# Patient Record
Sex: Female | Born: 1940
Health system: Southern US, Community
[De-identification: ages and names within clinical notes are randomized; demographics above are authoritative.]

## PROBLEM LIST (undated history)

## (undated) DIAGNOSIS — M810 Age-related osteoporosis without current pathological fracture: Secondary | ICD-10-CM

---

## 2000-04-28 ENCOUNTER — Other Ambulatory Visit: Admission: RE | Admit: 2000-04-28 | Discharge: 2000-04-28 | Payer: Self-pay | Admitting: *Deleted

## 2000-08-31 ENCOUNTER — Encounter: Payer: Self-pay | Admitting: Family Medicine

## 2000-08-31 ENCOUNTER — Encounter: Admission: RE | Admit: 2000-08-31 | Discharge: 2000-08-31 | Payer: Self-pay | Admitting: Family Medicine

## 2002-03-17 ENCOUNTER — Ambulatory Visit (HOSPITAL_BASED_OUTPATIENT_CLINIC_OR_DEPARTMENT_OTHER): Admission: RE | Admit: 2002-03-17 | Discharge: 2002-03-17 | Payer: Self-pay | Admitting: Orthopedic Surgery

## 2002-05-01 ENCOUNTER — Other Ambulatory Visit: Admission: RE | Admit: 2002-05-01 | Discharge: 2002-05-01 | Payer: Self-pay | Admitting: Obstetrics & Gynecology

## 2008-12-13 ENCOUNTER — Encounter: Admission: RE | Admit: 2008-12-13 | Discharge: 2008-12-13 | Payer: Self-pay | Admitting: Family Medicine

## 2010-07-04 NOTE — Op Note (Signed)
NAME:  Dana Watson, Dana Watson                           ACCOUNT NO.:  [id number]   MEDICAL RECORD NO.:  [id number]                   PATIENT TYPE:  AMB   LOCATION:  DSC                                  FACILITY:  [facility]   PHYSICIAN:  Katy Fitch. Naaman Plummer., M.D.          DATE OF BIRTH:  September 13, 1940   DATE OF PROCEDURE:  03/17/2002  DATE OF DISCHARGE:                                 OPERATIVE REPORT   PREOPERATIVE DIAGNOSIS:  Enlarging mucous cyst, left index finger distal  interphalangeal joint, with degenerative arthritis of distal interphalangeal  joint.   POSTOPERATIVE DIAGNOSIS:  Enlarging mucous cyst, left index finger distal  interphalangeal joint, with degenerative arthritis of distal interphalangeal  joint.   PROCEDURES:  1. Arthrotomy of left index finger distal interphalangeal joint with     debridement of osteophytes from adjacent surfaces of distal and middle     phalanges.  2. Excision of mucous cyst from periungual region, left index finger.   SURGEON:  Katy Fitch. Sypher, M.D.   ASSISTANT:  Jonni Sanger, P.A.   ANESTHESIA:  0.25% Marcaine and 2% lidocaine metacarpal-head level block,  left index finger, supplemented by IV sedation supervised by the  anesthesiologist, Janetta Hora. Gelene Mink, M.D.   INDICATIONS:  The patient is a 70 year old woman referred for evaluation and  management of a large mucous cyst of the left index finger distal  interphalangeal joint.   After informed consent, she is brought to the operating room at this time  for excision and joint debridement.  Preoperative x-rays demonstrated  significant degenerative arthritis at her DIP joint.   DESCRIPTION OF PROCEDURE:  The patient is brought to the operating room and  placed in the supine position on the operating table.  Following light  sedation, the left arm was prepped with Betadine soap and solution and  sterilely draped.  Marcaine 0.25% and 2% lidocaine were infiltrated at the  metacarpal  head level to obtain a digital block.   When anesthesia was satisfactory, the procedure commenced with a curvilinear  incision directly over the cyst.  The subcutaneous tissues were carefully  divided with the scissors, revealing a well-circumscribed mucous cyst.  The  cyst was circumferentially dissected and excised from the joint capsule with  a small portion of normal capsule between the terminal extensor tendon slip  and the ulnar collateral ligament of the DIP joint.   The joint was then entered and a microcurette was used to remove osteophytes  from the adjacent surfaces of the distal and middle phalanges.   The joint was thoroughly irrigated with sterile saline, followed by thorough  debridement of the periungual region to remove all remnants of the cyst  membrane.   The wound was then repaired with intradermal 3-0 Prolene.  There were no  apparent complications.   For aftercare the patient was given a prescription for Keflex 500 mg one  p.o. q.8h. x4 days  as a prophylactic antibiotic, also Darvocet-N 100 one  p.o. q.4h. p.r.n. pan, 24 tablets without refill.   The patient will return for follow-up in seven to 10 days for dressing  change and suture removal.                                               Katy Fitch. Naaman Plummer., M.D.    RVS/MEDQ  D:  03/17/2002  T:  03/17/2002  Job:  [id number]

## 2013-12-20 ENCOUNTER — Encounter: Payer: Self-pay | Admitting: Internal Medicine

## 2014-05-14 ENCOUNTER — Encounter: Payer: Self-pay | Admitting: Internal Medicine

## 2014-12-24 ENCOUNTER — Other Ambulatory Visit: Payer: Self-pay | Admitting: Physician Assistant

## 2014-12-24 DIAGNOSIS — M25562 Pain in left knee: Secondary | ICD-10-CM

## 2015-01-07 ENCOUNTER — Ambulatory Visit
Admission: RE | Admit: 2015-01-07 | Discharge: 2015-01-07 | Disposition: A | Discharge status: Home / Self Care | Payer: Self-pay | Source: Ambulatory Visit | Attending: Physician Assistant | Admitting: Physician Assistant

## 2015-01-07 DIAGNOSIS — M25562 Pain in left knee: Secondary | ICD-10-CM

## 2015-02-19 DIAGNOSIS — J019 Acute sinusitis, unspecified: Secondary | ICD-10-CM | POA: Diagnosis not present

## 2015-02-27 DIAGNOSIS — M25462 Effusion, left knee: Secondary | ICD-10-CM | POA: Diagnosis not present

## 2015-02-27 DIAGNOSIS — M25562 Pain in left knee: Secondary | ICD-10-CM | POA: Diagnosis not present

## 2015-05-22 DIAGNOSIS — M25562 Pain in left knee: Secondary | ICD-10-CM | POA: Diagnosis not present

## 2015-05-22 DIAGNOSIS — M25462 Effusion, left knee: Secondary | ICD-10-CM | POA: Diagnosis not present

## 2015-06-19 DIAGNOSIS — M1712 Unilateral primary osteoarthritis, left knee: Secondary | ICD-10-CM | POA: Diagnosis not present

## 2015-08-19 DIAGNOSIS — M25562 Pain in left knee: Secondary | ICD-10-CM | POA: Diagnosis not present

## 2015-09-12 DIAGNOSIS — Y999 Unspecified external cause status: Secondary | ICD-10-CM | POA: Diagnosis not present

## 2015-09-12 DIAGNOSIS — M23332 Other meniscus derangements, other medial meniscus, left knee: Secondary | ICD-10-CM | POA: Diagnosis not present

## 2015-09-12 DIAGNOSIS — S83242A Other tear of medial meniscus, current injury, left knee, initial encounter: Secondary | ICD-10-CM | POA: Diagnosis not present

## 2015-09-12 DIAGNOSIS — M659 Synovitis and tenosynovitis, unspecified: Secondary | ICD-10-CM | POA: Diagnosis not present

## 2015-09-12 DIAGNOSIS — M794 Hypertrophy of (infrapatellar) fat pad: Secondary | ICD-10-CM | POA: Diagnosis not present

## 2015-09-30 DIAGNOSIS — Z803 Family history of malignant neoplasm of breast: Secondary | ICD-10-CM | POA: Diagnosis not present

## 2015-09-30 DIAGNOSIS — Z1231 Encounter for screening mammogram for malignant neoplasm of breast: Secondary | ICD-10-CM | POA: Diagnosis not present

## 2015-10-17 DIAGNOSIS — M25562 Pain in left knee: Secondary | ICD-10-CM | POA: Diagnosis not present

## 2015-12-09 DIAGNOSIS — Z23 Encounter for immunization: Secondary | ICD-10-CM | POA: Diagnosis not present

## 2016-01-23 DIAGNOSIS — M810 Age-related osteoporosis without current pathological fracture: Secondary | ICD-10-CM | POA: Diagnosis not present

## 2016-01-29 DIAGNOSIS — Z0000 Encounter for general adult medical examination without abnormal findings: Secondary | ICD-10-CM | POA: Diagnosis not present

## 2016-01-29 DIAGNOSIS — M8580 Other specified disorders of bone density and structure, unspecified site: Secondary | ICD-10-CM | POA: Diagnosis not present

## 2016-02-06 DIAGNOSIS — H5213 Myopia, bilateral: Secondary | ICD-10-CM | POA: Diagnosis not present

## 2016-02-06 DIAGNOSIS — H52222 Regular astigmatism, left eye: Secondary | ICD-10-CM | POA: Diagnosis not present

## 2016-02-06 DIAGNOSIS — H524 Presbyopia: Secondary | ICD-10-CM | POA: Diagnosis not present

## 2016-06-15 ENCOUNTER — Other Ambulatory Visit: Payer: Self-pay | Admitting: Physician Assistant

## 2016-06-15 DIAGNOSIS — M5416 Radiculopathy, lumbar region: Secondary | ICD-10-CM

## 2016-10-06 DIAGNOSIS — Z1231 Encounter for screening mammogram for malignant neoplasm of breast: Secondary | ICD-10-CM | POA: Diagnosis not present

## 2016-11-19 DIAGNOSIS — Z23 Encounter for immunization: Secondary | ICD-10-CM | POA: Diagnosis not present

## 2017-01-29 DIAGNOSIS — M1990 Unspecified osteoarthritis, unspecified site: Secondary | ICD-10-CM | POA: Diagnosis not present

## 2017-01-29 DIAGNOSIS — M810 Age-related osteoporosis without current pathological fracture: Secondary | ICD-10-CM | POA: Diagnosis not present

## 2017-01-29 DIAGNOSIS — Z0000 Encounter for general adult medical examination without abnormal findings: Secondary | ICD-10-CM | POA: Diagnosis not present

## 2017-01-29 DIAGNOSIS — G2581 Restless legs syndrome: Secondary | ICD-10-CM | POA: Diagnosis not present

## 2017-06-28 DIAGNOSIS — J069 Acute upper respiratory infection, unspecified: Secondary | ICD-10-CM | POA: Diagnosis not present

## 2017-10-12 DIAGNOSIS — Z803 Family history of malignant neoplasm of breast: Secondary | ICD-10-CM | POA: Diagnosis not present

## 2017-10-12 DIAGNOSIS — Z1231 Encounter for screening mammogram for malignant neoplasm of breast: Secondary | ICD-10-CM | POA: Diagnosis not present

## 2017-12-03 DIAGNOSIS — Z23 Encounter for immunization: Secondary | ICD-10-CM | POA: Diagnosis not present

## 2018-01-25 DIAGNOSIS — Z8262 Family history of osteoporosis: Secondary | ICD-10-CM | POA: Diagnosis not present

## 2018-01-25 DIAGNOSIS — M8589 Other specified disorders of bone density and structure, multiple sites: Secondary | ICD-10-CM | POA: Diagnosis not present

## 2018-01-25 DIAGNOSIS — Z90710 Acquired absence of both cervix and uterus: Secondary | ICD-10-CM | POA: Diagnosis not present

## 2018-02-01 DIAGNOSIS — Z0000 Encounter for general adult medical examination without abnormal findings: Secondary | ICD-10-CM | POA: Diagnosis not present

## 2018-02-01 DIAGNOSIS — I493 Ventricular premature depolarization: Secondary | ICD-10-CM | POA: Diagnosis not present

## 2018-02-01 DIAGNOSIS — Z13220 Encounter for screening for lipoid disorders: Secondary | ICD-10-CM | POA: Diagnosis not present

## 2018-02-01 DIAGNOSIS — M1990 Unspecified osteoarthritis, unspecified site: Secondary | ICD-10-CM | POA: Diagnosis not present

## 2018-02-01 DIAGNOSIS — M810 Age-related osteoporosis without current pathological fracture: Secondary | ICD-10-CM | POA: Diagnosis not present

## 2018-02-01 DIAGNOSIS — G2581 Restless legs syndrome: Secondary | ICD-10-CM | POA: Diagnosis not present

## 2018-03-21 DIAGNOSIS — J069 Acute upper respiratory infection, unspecified: Secondary | ICD-10-CM | POA: Diagnosis not present

## 2018-10-21 DIAGNOSIS — Z803 Family history of malignant neoplasm of breast: Secondary | ICD-10-CM | POA: Diagnosis not present

## 2018-10-21 DIAGNOSIS — Z1231 Encounter for screening mammogram for malignant neoplasm of breast: Secondary | ICD-10-CM | POA: Diagnosis not present

## 2018-11-19 DIAGNOSIS — Z23 Encounter for immunization: Secondary | ICD-10-CM | POA: Diagnosis not present

## 2019-02-07 ENCOUNTER — Other Ambulatory Visit: Payer: Self-pay | Admitting: Family Medicine

## 2019-02-07 ENCOUNTER — Ambulatory Visit
Admission: RE | Admit: 2019-02-07 | Discharge: 2019-02-07 | Disposition: A | Discharge status: Home / Self Care | Payer: PPO | Source: Ambulatory Visit | Attending: Family Medicine | Admitting: Family Medicine

## 2019-02-07 DIAGNOSIS — S99922A Unspecified injury of left foot, initial encounter: Secondary | ICD-10-CM | POA: Diagnosis not present

## 2019-02-07 DIAGNOSIS — M25562 Pain in left knee: Secondary | ICD-10-CM | POA: Diagnosis not present

## 2019-02-07 DIAGNOSIS — R52 Pain, unspecified: Secondary | ICD-10-CM

## 2019-02-07 DIAGNOSIS — W19XXXA Unspecified fall, initial encounter: Secondary | ICD-10-CM | POA: Diagnosis not present

## 2019-02-07 DIAGNOSIS — M25572 Pain in left ankle and joints of left foot: Secondary | ICD-10-CM | POA: Diagnosis not present

## 2019-02-07 DIAGNOSIS — M79672 Pain in left foot: Secondary | ICD-10-CM | POA: Diagnosis not present

## 2019-02-07 DIAGNOSIS — M25462 Effusion, left knee: Secondary | ICD-10-CM | POA: Diagnosis not present

## 2019-02-08 ENCOUNTER — Other Ambulatory Visit: Payer: Self-pay

## 2019-02-08 ENCOUNTER — Telehealth: Payer: Self-pay

## 2019-02-08 ENCOUNTER — Ambulatory Visit (INDEPENDENT_AMBULATORY_CARE_PROVIDER_SITE_OTHER): Payer: PPO | Admitting: Orthopaedic Surgery

## 2019-02-08 DIAGNOSIS — M25562 Pain in left knee: Secondary | ICD-10-CM | POA: Diagnosis not present

## 2019-02-08 DIAGNOSIS — S93602A Unspecified sprain of left foot, initial encounter: Secondary | ICD-10-CM

## 2019-02-08 DIAGNOSIS — G8929 Other chronic pain: Secondary | ICD-10-CM | POA: Diagnosis not present

## 2019-02-08 DIAGNOSIS — M1712 Unilateral primary osteoarthritis, left knee: Secondary | ICD-10-CM | POA: Diagnosis not present

## 2019-02-08 MED ORDER — LIDOCAINE HCL 1 % IJ SOLN
3.0000 mL | INTRAMUSCULAR | Status: AC | PRN
  Administered 2019-02-08: 3 mL

## 2019-02-08 MED ORDER — METHYLPREDNISOLONE ACETATE 40 MG/ML IJ SUSP
40.0000 mg | INTRAMUSCULAR | Status: AC | PRN
  Administered 2019-02-08: 40 mg via INTRA_ARTICULAR

## 2019-02-08 NOTE — Telephone Encounter (Signed)
Left knee gel injection ?

## 2019-02-08 NOTE — Telephone Encounter (Signed)
Noted. Will submit after 02/20/2019. 

## 2019-02-08 NOTE — Progress Notes (Signed)
Office Visit Note   Patient: Dana Watson           Date of Birth: 1940-09-09           MRN: [id number] Visit Date: 02/08/2019              Requested by: Maurice Small, MD [address] [place],  [place] [address] PCP: Maurice Small, MD   Assessment & Plan: Visit Diagnoses:  1. Chronic pain of left knee   2. Unilateral primary osteoarthritis, left knee   3. Foot sprain, left, initial encounter     Plan: I was able to aspirate about 20 cc of bloody fluid from the knee.  I then placed a steroid in her left knee which she tolerated well.  Since she is already improving with her foot and ankle from Monday with the postop shoe we will just have her continue that with weightbearing as tolerated.  The x-rays of her left knee are quite concerning for significant worsening of arthritic changes in the knee.  I do feel a steroid injection will be helpful for her.  I also feel that she is a candidate for hyaluronic acid for the left knee given the arthritis in her age.  She is interested in this as well.  Hopefully we will see her back in 4 weeks we can place hyaluronic acid into the left knee.  All question concerns were answered and addressed.  Follow-Up Instructions: Return in about 4 weeks (around 03/08/2019).   Orders:  Orders Placed This Encounter  Procedures  . Large Joint Inj   No orders of the defined types were placed in this encounter.     Procedures: Large Joint Inj: L knee on 02/08/2019 11:01 AM Indications: diagnostic evaluation and pain Details: 22 G 1.5 in needle, superolateral approach  Arthrogram: No  Medications: 3 mL lidocaine 1 %; 40 mg methylPREDNISolone acetate 40 MG/ML Outcome: tolerated well, no immediate complications Procedure, treatment alternatives, risks and benefits explained, specific risks discussed. Consent was given by the patient. Immediately prior to procedure a time out was called to verify the correct patient, procedure,  equipment, support staff and site/side marked as required. Patient was prepped and draped in the usual sterile fashion.       Clinical Data: No additional findings.   Subjective: Chief Complaint  Patient presents with  . Left Foot - Pain  The patient is a well-known patient of mine.  She is 78 years old and actually fell this past Monday down some steps injuring her left foot and actually her left knee.  Have seen her before for this left knee and actually performed an arthroscopic surgery on it in 2016 for PVNS.  She said the knee is already been bothering her for little bit but now the foot is hurting.  X-rays were obtained of the knee and the foot on Monday and I can review these on the canopy system.  Her primary care physician put her in a postop shoe and she says this is helped.  She denies any syncopal episode and says it was just an accidental tripping incident when she fell.  She does report swelling in that left leg from the knee down to the foot and ankle.  She was going to schedule pulmonary see [place] earlier anyway for her knee before she fell.  HPI  Review of Systems She currently denies any headache, chest pain, shortness of breath, fever, chills, nausea, vomiting  Objective: Vital  Signs: There were no vitals taken for this visit.  Physical Exam She is alert orient x3 and in no acute distress Ortho Exam Examination of her left knee does show swelling and there is pitting edema from the knee down to the foot and ankle.  There is swelling at the midfoot and pain to the midfoot on stress but there is no instability on exam of the foot or ankle.  She has good range of motion of her knee foot and ankle. Specialty Comments:  No specialty comments available.  Imaging: DG Knee Complete 4 Views Left  Result Date: 02/07/2019 CLINICAL DATA:  Trauma with pain EXAM: LEFT KNEE - COMPLETE 4+ VIEW COMPARISON:  None. FINDINGS: Joint effusion. Generalized osteopenia. No acute fracture.  Marked chondrocalcinosis. IMPRESSION: No acute fracture.  Joint effusion. Electronically Signed   By: Leticia Penna M.D.   On: 02/07/2019 11:14   DG Foot Complete Left  Result Date: 02/07/2019 CLINICAL DATA:  Trauma with pain EXAM: LEFT FOOT - COMPLETE 3+ VIEW COMPARISON:  None. FINDINGS: There is no evidence of fracture or dislocation. There is no evidence of arthropathy or other focal bone abnormality. Soft tissues are unremarkable. IMPRESSION: Negative. Electronically Signed   By: Leticia Penna M.D.   On: 02/07/2019 11:13     PMFS History: There are no problems to display for this patient.  No past medical history on file.  No family history on file.   Social History   Occupational History  . Not on file  Tobacco Use  . Smoking status: Not on file  Substance and Sexual Activity  . Alcohol use: Not on file  . Drug use: Not on file  . Sexual activity: Not on file

## 2019-02-16 DIAGNOSIS — M810 Age-related osteoporosis without current pathological fracture: Secondary | ICD-10-CM | POA: Diagnosis not present

## 2019-02-16 DIAGNOSIS — Z0000 Encounter for general adult medical examination without abnormal findings: Secondary | ICD-10-CM | POA: Diagnosis not present

## 2019-02-20 ENCOUNTER — Telehealth: Payer: Self-pay

## 2019-02-20 NOTE — Telephone Encounter (Signed)
Submitted VOB for Monovisc, left knee. 

## 2019-02-28 ENCOUNTER — Telehealth: Payer: Self-pay

## 2019-02-28 NOTE — Telephone Encounter (Signed)
Approved for Monovisc, left knee. [employer] Deductible does not apply Patient is covered at 100% through her secondary insurance No Co-pay No PA required  Appt. 03/08/2019 with Dr. Magnus Ivan

## 2019-03-08 ENCOUNTER — Ambulatory Visit (INDEPENDENT_AMBULATORY_CARE_PROVIDER_SITE_OTHER): Payer: PPO | Admitting: Orthopaedic Surgery

## 2019-03-08 ENCOUNTER — Encounter: Payer: Self-pay | Admitting: Orthopaedic Surgery

## 2019-03-08 ENCOUNTER — Other Ambulatory Visit: Payer: Self-pay

## 2019-03-08 DIAGNOSIS — G8929 Other chronic pain: Secondary | ICD-10-CM

## 2019-03-08 DIAGNOSIS — M1712 Unilateral primary osteoarthritis, left knee: Secondary | ICD-10-CM

## 2019-03-08 DIAGNOSIS — M25562 Pain in left knee: Secondary | ICD-10-CM

## 2019-03-08 MED ORDER — HYALURONAN 88 MG/4ML IX SOSY
88.0000 mg | PREFILLED_SYRINGE | INTRA_ARTICULAR | Status: AC | PRN
  Administered 2019-03-08: 88 mg via INTRA_ARTICULAR

## 2019-03-08 NOTE — Progress Notes (Signed)
   Procedure Note  Patient: Dana Watson             Date of Birth: 07/20/1940           MRN: [id number]             Visit Date: 03/08/2019  Procedures: Visit Diagnoses:  1. Unilateral primary osteoarthritis, left knee   2. Chronic pain of left knee     Large Joint Inj: L knee on 03/08/2019 1:39 PM Indications: diagnostic evaluation and pain Details: 22 G 1.5 in needle, superolateral approach  Arthrogram: No  Medications: 88 mg Hyaluronan 88 MG/4ML Outcome: tolerated well, no immediate complications Procedure, treatment alternatives, risks and benefits explained, specific risks discussed. Consent was given by the patient. Immediately prior to procedure a time out was called to verify the correct patient, procedure, equipment, support staff and site/side marked as required. Patient was prepped and draped in the usual sterile fashion.    Patient comes in today for a scheduled Monovisc injection in her left knee to treat the pain from osteoarthritis.  She is tried and failed other forms of conservative treatment.  She does have osteoarthritis in that knee as well as chronic PVNS.  She is even tried steroid injections.  She is a very active 79 year old female.  She also has chronic left ankle swelling.  It does subside when she has been off of her ankle and she wears compressive garments.  She has had no other acute change in her medical status.  I did have a long thorough discussion with her about hyaluronic acid and the risk and benefits of this and why we are trying it.  I did place hyaluronic acid in the left knee without difficulty today.  All question concerns were answered and addressed.  At this point follow-up will be as needed.

## 2019-10-24 DIAGNOSIS — Z1231 Encounter for screening mammogram for malignant neoplasm of breast: Secondary | ICD-10-CM | POA: Diagnosis not present

## 2019-11-11 DIAGNOSIS — Z23 Encounter for immunization: Secondary | ICD-10-CM | POA: Diagnosis not present

## 2019-11-30 ENCOUNTER — Telehealth: Payer: Self-pay | Admitting: Orthopaedic Surgery

## 2019-11-30 NOTE — Telephone Encounter (Signed)
Patient called needing to get the gel injection again for her left knee.  The number to contact patient is [phone]

## 2019-11-30 NOTE — Telephone Encounter (Signed)
As long as it is been 6 months, it is fine to order that for her.

## 2019-11-30 NOTE — Telephone Encounter (Signed)
Can you advise please  

## 2019-12-01 NOTE — Telephone Encounter (Signed)
Noted  

## 2019-12-07 ENCOUNTER — Ambulatory Visit (INDEPENDENT_AMBULATORY_CARE_PROVIDER_SITE_OTHER): Payer: PPO | Admitting: Orthopaedic Surgery

## 2019-12-07 DIAGNOSIS — G8929 Other chronic pain: Secondary | ICD-10-CM | POA: Diagnosis not present

## 2019-12-07 DIAGNOSIS — M1712 Unilateral primary osteoarthritis, left knee: Secondary | ICD-10-CM | POA: Diagnosis not present

## 2019-12-07 DIAGNOSIS — M25562 Pain in left knee: Secondary | ICD-10-CM | POA: Diagnosis not present

## 2019-12-07 MED ORDER — LIDOCAINE HCL 1 % IJ SOLN
3.0000 mL | INTRAMUSCULAR | Status: AC | PRN
  Administered 2019-12-07: 3 mL

## 2019-12-07 MED ORDER — METHYLPREDNISOLONE ACETATE 40 MG/ML IJ SUSP
40.0000 mg | INTRAMUSCULAR | Status: AC | PRN
  Administered 2019-12-07: 40 mg via INTRA_ARTICULAR

## 2019-12-07 NOTE — Progress Notes (Signed)
Office Visit Note   Patient: Dana Watson           Date of Birth: Sep 12, 1940           MRN: [id number] Visit Date: 12/07/2019              Requested by: Maurice Small, MD [address] [place],  [place] [address] PCP: Maurice Small, MD   Assessment & Plan: Visit Diagnoses:  1. Unilateral primary osteoarthritis, left knee   2. Chronic pain of left knee     Plan: I did place a steroid injection easily into the left knee today.  We will work on ordering the hyaluronic acid injection with Monovisc for her left knee and see her back once that is approved.  I have recommended that she start wearing her ASO again and see what I does for her ankle and try to wear it for the next 4 to 6 weeks during activities.  Follow-Up Instructions: No follow-ups on file.   Orders:  No orders of the defined types were placed in this encounter.  No orders of the defined types were placed in this encounter.     Procedures: Large Joint Inj: L knee on 12/07/2019 3:42 PM Indications: diagnostic evaluation and pain Details: 22 G 1.5 in needle, superolateral approach  Arthrogram: No  Medications: 3 mL lidocaine 1 %; 40 mg methylPREDNISolone acetate 40 MG/ML Outcome: tolerated well, no immediate complications Procedure, treatment alternatives, risks and benefits explained, specific risks discussed. Consent was given by the patient. Immediately prior to procedure a time out was called to verify the correct patient, procedure, equipment, support staff and site/side marked as required. Patient was prepped and draped in the usual sterile fashion.       Clinical Data: No additional findings.   Subjective: Chief Complaint  Patient presents with  . Left Knee - Pain  The patient is well-known to me.  She has osteoarthritis of her left knee and chronic pain.  She has done very well with the regimen of a steroid injection and then her pain is improved significantly with hyaluronic  acid and Monovisc.  She last had a Monovisc injection in her left knee in January of this year.  She is in the process of having this ordered again for her knee.  In order to temporize her acute pain, she is requesting steroid injection for her knee.  She is also been dealing with left ankle pain from where she sprained her ankle earlier this year.  She does have a lace up ankle brace at home but has not been wearing it.  She has had no other acute change in medical status.  HPI  Review of Systems She currently denies any headache, chest pain, shortness of breath, fever, chills, nausea, vomiting  Objective: Vital Signs: There were no vitals taken for this visit.  Physical Exam She is alert and oriented x3 and in no acute distress Ortho Exam Examination of her left knee does show some mild swelling and global tenderness but good range of motion.  There is a little bit of patellofemoral crepitation.  Her left ankle is ligamentously stable with pain just over the anterior talofibular ligament and some over the peroneal tendons but there is no subluxation of the tendons. Specialty Comments:  No specialty comments available.  Imaging: No results found.   PMFS History: There are no problems to display for this patient.  No past medical history on file.  No  family history on file.  No past surgical history on file. Social History   Occupational History  . Not on file  Tobacco Use  . Smoking status: Not on file  Substance and Sexual Activity  . Alcohol use: Not on file  . Drug use: Not on file  . Sexual activity: Not on file

## 2019-12-18 ENCOUNTER — Telehealth: Payer: Self-pay

## 2019-12-18 NOTE — Telephone Encounter (Signed)
Submitted VOB, Monovisc, left knee. 

## 2020-01-03 DIAGNOSIS — Z20822 Contact with and (suspected) exposure to covid-19: Secondary | ICD-10-CM | POA: Diagnosis not present

## 2020-01-03 DIAGNOSIS — Z1152 Encounter for screening for COVID-19: Secondary | ICD-10-CM | POA: Diagnosis not present

## 2020-01-03 DIAGNOSIS — Z6821 Body mass index (BMI) 21.0-21.9, adult: Secondary | ICD-10-CM | POA: Diagnosis not present

## 2020-01-19 ENCOUNTER — Telehealth: Payer: Self-pay

## 2020-01-19 NOTE — Telephone Encounter (Signed)
Approved for Monovisc-Left knee Buy and Bill Dr. Magnus Ivan Covered @ 100%-[facility] to pick up cost after [employer]  No prior auth required    Ok to schedule @ next available

## 2020-01-22 NOTE — Telephone Encounter (Signed)
Lvm for pt to call back to schedule °

## 2020-03-18 ENCOUNTER — Ambulatory Visit: Payer: Self-pay

## 2020-03-18 ENCOUNTER — Ambulatory Visit (INDEPENDENT_AMBULATORY_CARE_PROVIDER_SITE_OTHER): Payer: PPO | Admitting: Orthopaedic Surgery

## 2020-03-18 DIAGNOSIS — M25562 Pain in left knee: Secondary | ICD-10-CM | POA: Diagnosis not present

## 2020-03-18 DIAGNOSIS — G8929 Other chronic pain: Secondary | ICD-10-CM

## 2020-03-18 DIAGNOSIS — M1712 Unilateral primary osteoarthritis, left knee: Secondary | ICD-10-CM | POA: Diagnosis not present

## 2020-03-18 MED ORDER — HYALURONAN 88 MG/4ML IX SOSY
88.0000 mg | PREFILLED_SYRINGE | INTRA_ARTICULAR | Status: AC | PRN
  Administered 2020-03-18: 88 mg via INTRA_ARTICULAR

## 2020-03-18 MED ORDER — LIDOCAINE HCL 1 % IJ SOLN
3.0000 mL | INTRAMUSCULAR | Status: AC | PRN
  Administered 2020-03-18: 3 mL

## 2020-03-18 NOTE — Progress Notes (Signed)
Office Visit Note   Patient: Dana Watson           Date of Birth: 05/16/1940           MRN: [id number] Visit Date: 03/18/2020              Requested by: Maurice Small, MD [address] [place],  [place] [address] PCP: Maurice Small, MD   Assessment & Plan: Visit Diagnoses:  1. Chronic pain of left knee   2. Unilateral primary osteoarthritis, left knee     Plan: I do feel it is worth trying a left knee aspiration today and place Monovisc in her left knee to see if this will help from a pain standpoint.  We can then reevaluate her in 6 weeks to see how she is done with that.  The only other option after that would be a synovectomy with knee replacement for the left knee.  She agrees with this treatment plan.  Follow-Up Instructions: Return in about 6 weeks (around 04/29/2020).   Orders:  Orders Placed This Encounter  Procedures  . Large Joint Inj  . XR Knee 1-2 Views Left   No orders of the defined types were placed in this encounter.     Procedures: Large Joint Inj: L knee on 03/18/2020 9:13 AM Indications: diagnostic evaluation and pain Details: 22 G 1.5 in needle, superolateral approach  Arthrogram: No  Medications: 3 mL lidocaine 1 %; 88 mg Hyaluronan 88 MG/4ML Outcome: tolerated well, no immediate complications Procedure, treatment alternatives, risks and benefits explained, specific risks discussed. Consent was given by the patient. Immediately prior to procedure a time out was called to verify the correct patient, procedure, equipment, support staff and site/side marked as required. Patient was prepped and draped in the usual sterile fashion.       Clinical Data: No additional findings.   Subjective: Chief Complaint  Patient presents with  . Left Knee - Pain  The patient is well-known to me.  She is a very active and young appearing 80 year old female with chronic pain involving her left knee.  She has had PVNS of her knee before.  We  have performed arthroscopic surgery on that knee and she has had multiple aspirations and steroid injections.  She is here today to have her knee examined and assessed again in terms of her left knee.  She wants to know whether or not we would consider hyaluronic acid injection with Monovisc which she has been approved for versus setting her up for knee replacement surgery.  She says she cannot exercise well due to the pain and swelling of her left knee.  HPI  Review of Systems She currently denies any headache, chest pain, shortness of breath, fever, chills, nausea, vomiting  Objective: Vital Signs: There were no vitals taken for this visit.  Physical Exam She is alert and orient x3 and in no acute distress Ortho Exam Examination of her left knee does show moderate to moderate effusion.  She has good range of motion the knee and is stable.  Most of her pain seems to be along the lateral joint line.  The knee moves well but it is slightly warm.  There is no redness. Specialty Comments:  No specialty comments available.  Imaging: XR Knee 1-2 Views Left  Result Date: 03/18/2020 2 views of the left knee show still decently maintained medial lateral compartments.  There is a lot of calcifications around the medial lateral meniscus.  PMFS History: There are no problems to display for this patient.  No past medical history on file.  No family history on file.  No past surgical history on file. Social History   Occupational History  . Not on file  Tobacco Use  . Smoking status: Not on file  . Smokeless tobacco: Not on file  Substance and Sexual Activity  . Alcohol use: Not on file  . Drug use: Not on file  . Sexual activity: Not on file

## 2020-03-19 DIAGNOSIS — M1990 Unspecified osteoarthritis, unspecified site: Secondary | ICD-10-CM | POA: Diagnosis not present

## 2020-03-19 DIAGNOSIS — Z0000 Encounter for general adult medical examination without abnormal findings: Secondary | ICD-10-CM | POA: Diagnosis not present

## 2020-03-19 DIAGNOSIS — M810 Age-related osteoporosis without current pathological fracture: Secondary | ICD-10-CM | POA: Diagnosis not present

## 2020-03-19 DIAGNOSIS — Z23 Encounter for immunization: Secondary | ICD-10-CM | POA: Diagnosis not present

## 2020-03-30 DIAGNOSIS — Z20822 Contact with and (suspected) exposure to covid-19: Secondary | ICD-10-CM | POA: Diagnosis not present

## 2020-04-22 ENCOUNTER — Encounter: Payer: Self-pay | Admitting: Orthopaedic Surgery

## 2020-04-22 ENCOUNTER — Ambulatory Visit (INDEPENDENT_AMBULATORY_CARE_PROVIDER_SITE_OTHER): Payer: PPO | Admitting: Orthopaedic Surgery

## 2020-04-22 DIAGNOSIS — M25562 Pain in left knee: Secondary | ICD-10-CM

## 2020-04-22 DIAGNOSIS — G8929 Other chronic pain: Secondary | ICD-10-CM | POA: Diagnosis not present

## 2020-04-22 DIAGNOSIS — M1712 Unilateral primary osteoarthritis, left knee: Secondary | ICD-10-CM | POA: Diagnosis not present

## 2020-04-22 NOTE — Progress Notes (Signed)
The patient comes in for follow-up after having a hyaluronic acid injection with aspiration in her left knee.  She does have known osteoarthritis of the left knee with PVNS as well.  She says the knee is doing well and she is not having any issues at all.  She is 80 years old.  She is back to doing her aerobic activities.  Examination of her left knee today shows just a mild effusion but excellent range of motion no pain on my exam.  She understands that she will have times where the pain and swelling waxes and wanes given her diagnosis.  Right now, I agree with no other interventions and will hold on surgery until he is aware this is more detriment affecting her quality of life and her mobility.  I would recommend repeat gel injection once it has been 6 months from the last gel injection but only of as needed.  We can always aspirate her knee and place a steroid injection in it in the interim if needed.  All question concerns were answered and addressed.  She knows to come see [place] if things worsen.

## 2020-10-16 ENCOUNTER — Ambulatory Visit (HOSPITAL_COMMUNITY)
Admission: EM | Admit: 2020-10-16 | Discharge: 2020-10-16 | Disposition: A | Discharge status: Home / Self Care | Payer: PPO | Attending: Internal Medicine | Admitting: Internal Medicine

## 2020-10-16 ENCOUNTER — Encounter (HOSPITAL_COMMUNITY): Payer: Self-pay

## 2020-10-16 DIAGNOSIS — Z0189 Encounter for other specified special examinations: Secondary | ICD-10-CM

## 2020-10-16 DIAGNOSIS — Z20822 Contact with and (suspected) exposure to covid-19: Secondary | ICD-10-CM | POA: Insufficient documentation

## 2020-10-16 LAB — SARS CORONAVIRUS 2 (TAT 6-24 HRS): SARS Coronavirus 2: NEGATIVE

## 2020-10-16 NOTE — ED Triage Notes (Signed)
Pt presents for COVID testing. Pt denies fever, shortness of breath, cough, or any other symptoms.    

## 2020-10-18 DIAGNOSIS — Z1152 Encounter for screening for COVID-19: Secondary | ICD-10-CM | POA: Diagnosis not present

## 2020-10-29 DIAGNOSIS — Z1231 Encounter for screening mammogram for malignant neoplasm of breast: Secondary | ICD-10-CM | POA: Diagnosis not present

## 2020-11-10 ENCOUNTER — Encounter (HOSPITAL_COMMUNITY): Payer: Self-pay | Admitting: *Deleted

## 2020-11-10 ENCOUNTER — Ambulatory Visit (HOSPITAL_COMMUNITY)
Admission: EM | Admit: 2020-11-10 | Discharge: 2020-11-10 | Disposition: A | Discharge status: Home / Self Care | Payer: PPO | Attending: Family Medicine | Admitting: Family Medicine

## 2020-11-10 ENCOUNTER — Other Ambulatory Visit: Payer: Self-pay

## 2020-11-10 DIAGNOSIS — N390 Urinary tract infection, site not specified: Secondary | ICD-10-CM | POA: Diagnosis not present

## 2020-11-10 LAB — POCT URINALYSIS DIPSTICK, ED / UC
Bilirubin Urine: NEGATIVE
Glucose, UA: NEGATIVE mg/dL
Ketones, ur: NEGATIVE mg/dL
Nitrite: NEGATIVE
Protein, ur: NEGATIVE mg/dL
Specific Gravity, Urine: 1.015 (ref 1.005–1.030)
Urobilinogen, UA: 0.2 mg/dL (ref 0.0–1.0)
pH: 7.5 (ref 5.0–8.0)

## 2020-11-10 MED ORDER — SULFAMETHOXAZOLE-TRIMETHOPRIM 800-160 MG PO TABS: 1.0000 | ORAL_TABLET | Freq: Two times a day (BID) | ORAL | 0 refills | Status: AC

## 2020-11-10 NOTE — ED Provider Notes (Signed)
[facility]    CSN: [id number] Arrival date & time: 11/10/20  1009      History   Chief Complaint Chief Complaint  Patient presents with   Hematuria   Dysuria   Urinary Frequency    HPI Dana Watson is a 80 y.o. female.   Patient presenting today with 2-day history of progressively worsening hematuria, dysuria, urinary frequency and suprapubic pressure.  Denies fever, chills, nausea, vomiting, diarrhea.  History of urinary tract infections but has been several years per patient.  Not tried anything over-the-counter for symptoms thus far.   History reviewed. No pertinent past medical history.  There are no problems to display for this patient.   History reviewed. No pertinent surgical history.  OB History   No obstetric history on file.      Home Medications    Prior to Admission medications   Medication Sig Start Date End Date Taking? Authorizing Provider  sulfamethoxazole-trimethoprim (BACTRIM DS) 800-160 MG tablet Take 1 tablet by mouth 2 (two) times daily for 3 days. 11/10/20 11/13/20 Yes Particia Nearing, PA-C  acetaminophen (TYLENOL) 325 MG tablet 1 tablet as needed    [provider]  alendronate (FOSAMAX) 70 MG tablet 1 tablet    [provider]  Calcium Carb-Cholecalciferol (CALCIUM 500 +D) 500-400 MG-UNIT TABS 1 tablet with a meal    [provider]  Chromium Picolinate 800 MCG TABS 1 tablet    [provider]  Zinc 50 MG TABS 1 tablet    [provider]    Family History History reviewed. No pertinent family history.  Social History Social History   Tobacco Use   Smoking status: Never   Smokeless tobacco: Never  Vaping Use   Vaping Use: Never used  Substance Use Topics   Drug use: Never     Allergies   Penicillin g   Review of Systems Review of Systems Per HPI  Physical Exam Triage Vital Signs ED Triage Vitals  Enc Vitals Group     BP 11/10/20 1038 (!) 155/70     Pulse  Rate 11/10/20 1038 63     Resp 11/10/20 1038 18     Temp 11/10/20 1038 98.6 F (37 C)     Temp src --      SpO2 11/10/20 1038 99 %     Weight --      Height --      Head Circumference --      Peak Flow --      Pain Score 11/10/20 1034 0     Pain Loc --      Pain Edu? --      Excl. in GC? --    No data found.  Updated Vital Signs BP (!) 155/70   Pulse 63   Temp 98.6 F (37 C)   Resp 18   SpO2 99%   Visual Acuity Right Eye Distance:   Left Eye Distance:   Bilateral Distance:    Right Eye Near:   Left Eye Near:    Bilateral Near:     Physical Exam Vitals and nursing note reviewed.  Constitutional:      Appearance: Normal appearance. She is not ill-appearing.  HENT:     Head: Atraumatic.     Nose: Nose normal.     Mouth/Throat:     Mouth: Mucous membranes are moist.     Pharynx: Oropharynx is clear.  Eyes:     Extraocular Movements: Extraocular movements intact.  Conjunctiva/sclera: Conjunctivae normal.  Cardiovascular:     Rate and Rhythm: Normal rate and regular rhythm.     Heart sounds: Normal heart sounds.  Pulmonary:     Effort: Pulmonary effort is normal.     Breath sounds: Normal breath sounds. No wheezing or rales.  Abdominal:     General: Bowel sounds are normal. There is no distension.     Palpations: Abdomen is soft.     Tenderness: There is no abdominal tenderness. There is no guarding.  Musculoskeletal:        General: Normal range of motion.     Cervical back: Normal range of motion and neck supple.  Skin:    General: Skin is warm and dry.  Neurological:     Mental Status: She is alert and oriented to person, place, and time.  Psychiatric:        Mood and Affect: Mood normal.        Thought Content: Thought content normal.        Judgment: Judgment normal.     UC Treatments / Results  Labs (all labs ordered are listed, but only abnormal results are displayed) Labs Reviewed  POCT URINALYSIS DIPSTICK, ED / UC - Abnormal; Notable  for the following components:      Result Value   Hgb urine dipstick LARGE (*)    Leukocytes,Ua LARGE (*)    All other components within normal limits  URINE CULTURE    EKG   Radiology No results found.  Procedures Procedures (including critical care time)  Medications Ordered in UC Medications - No data to display  Initial Impression / Assessment and Plan / UC Course  I have reviewed the triage vital signs and the nursing notes.  Pertinent labs & imaging results that were available during my care of the patient were reviewed by me and considered in my medical decision making (see chart for details).     Vitals and exam reassuring, UA showing evidence of a urinary tract infection.  We will start Bactrim and await urine culture.  Discussed to push fluids, over-the-counter symptomatic medications and return precautions.  Final Clinical Impressions(s) / UC Diagnoses   Final diagnoses:  Acute lower UTI   Discharge Instructions   None    ED Prescriptions     Medication Sig Dispense Auth. Provider   sulfamethoxazole-trimethoprim (BACTRIM DS) 800-160 MG tablet Take 1 tablet by mouth 2 (two) times daily for 3 days. 6 tablet Particia Nearing, [place]      PDMP not reviewed this encounter.   Particia Nearing, [place] 11/10/20 1432

## 2020-11-10 NOTE — ED Triage Notes (Signed)
Pt reports hematuria ,dysuria and frquency since Friday.

## 2020-11-11 LAB — URINE CULTURE: Culture: <10000 — AB

## 2020-11-18 DIAGNOSIS — Z23 Encounter for immunization: Secondary | ICD-10-CM | POA: Diagnosis not present

## 2021-03-20 DIAGNOSIS — T148XXA Other injury of unspecified body region, initial encounter: Secondary | ICD-10-CM | POA: Diagnosis not present

## 2021-03-20 DIAGNOSIS — Z0000 Encounter for general adult medical examination without abnormal findings: Secondary | ICD-10-CM | POA: Diagnosis not present

## 2021-03-20 DIAGNOSIS — M810 Age-related osteoporosis without current pathological fracture: Secondary | ICD-10-CM | POA: Diagnosis not present

## 2021-06-04 ENCOUNTER — Ambulatory Visit (INDEPENDENT_AMBULATORY_CARE_PROVIDER_SITE_OTHER): Payer: PPO | Admitting: Orthopaedic Surgery

## 2021-06-04 ENCOUNTER — Telehealth: Payer: Self-pay

## 2021-06-04 ENCOUNTER — Encounter: Payer: Self-pay | Admitting: Orthopaedic Surgery

## 2021-06-04 VITALS — Ht 69.5 in | Wt 144.2 lb

## 2021-06-04 DIAGNOSIS — M1712 Unilateral primary osteoarthritis, left knee: Secondary | ICD-10-CM

## 2021-06-04 MED ORDER — METHYLPREDNISOLONE ACETATE 40 MG/ML IJ SUSP
40.0000 mg | INTRAMUSCULAR | Status: AC | PRN
  Administered 2021-06-04: 40 mg via INTRA_ARTICULAR

## 2021-06-04 MED ORDER — LIDOCAINE HCL 1 % IJ SOLN
3.0000 mL | INTRAMUSCULAR | Status: AC | PRN
  Administered 2021-06-04: 3 mL

## 2021-06-04 NOTE — Telephone Encounter (Signed)
Please get auth for repeat gel injection for left knee-[facility] pt ?

## 2021-06-04 NOTE — Progress Notes (Signed)
? ?  Procedure Note ? ?Patient: Dana Watson             ?Date of Birth: 08/29/1940           ?MRN: [id number]             ?Visit Date: 06/04/2021 ? ?HPI: Dana Watson comes in today with left knee pain.  She has known osteoarthritis of the knee.  She last had a Monovisc injection in the knee on 03/18/2020.  States that she did well until just a few months ago when the knee started bothering her.  She is requesting a cortisone injection.  She denies any falls or injuries.  She is nondiabetic.  Denies any fevers chills. ? ?Review of systems: See HPI otherwise negative ? ?Physical exam: Left knee full extension full flexion.  Patellofemoral crepitus.  No abnormal warmth erythema or effusion.  No instability valgus varus stressing. ? ?Procedures: ?Visit Diagnoses:  ?1. Unilateral primary osteoarthritis, left knee   ? ? ?Large Joint Inj on 06/04/2021 9:39 AM ?Indications: pain ?Details: 22 G 1.5 in needle, anterolateral approach ? ?Arthrogram: No ? ?Medications: 3 mL lidocaine 1 %; 40 mg methylPREDNISolone acetate 40 MG/ML ?Outcome: tolerated well, no immediate complications ?Procedure, treatment alternatives, risks and benefits explained, specific risks discussed. Consent was given by the patient. Immediately prior to procedure a time out was called to verify the correct patient, procedure, equipment, support staff and site/side marked as required. Patient was prepped and draped in the usual sterile fashion.  ? ? ? ?Plan: She can continue to take ibuprofen for pain.  She will continue to exercise at [employer] and work on Dance movement psychotherapist.  We will try to obtain approval for repeat supplemental injection as she got more than a years worth of relief with the injection I think she is an excellent candidate to have this again.  She will follow-up with [place] once the supplemental supplemental injection is available. ? ?

## 2021-06-06 NOTE — Telephone Encounter (Signed)
Noted  

## 2021-07-08 ENCOUNTER — Ambulatory Visit (HOSPITAL_BASED_OUTPATIENT_CLINIC_OR_DEPARTMENT_OTHER)
Admission: RE | Admit: 2021-07-08 | Discharge: 2021-07-08 | Disposition: A | Discharge status: Home / Self Care | Payer: PPO | Source: Ambulatory Visit | Attending: Urgent Care | Admitting: Urgent Care

## 2021-07-08 ENCOUNTER — Ambulatory Visit
Admission: EM | Admit: 2021-07-08 | Discharge: 2021-07-08 | Disposition: A | Discharge status: Home / Self Care | Payer: PPO | Attending: Urgent Care | Admitting: Urgent Care

## 2021-07-08 DIAGNOSIS — R0902 Hypoxemia: Secondary | ICD-10-CM | POA: Insufficient documentation

## 2021-07-08 DIAGNOSIS — R197 Diarrhea, unspecified: Secondary | ICD-10-CM | POA: Diagnosis not present

## 2021-07-08 DIAGNOSIS — B349 Viral infection, unspecified: Secondary | ICD-10-CM

## 2021-07-08 DIAGNOSIS — R112 Nausea with vomiting, unspecified: Secondary | ICD-10-CM | POA: Diagnosis not present

## 2021-07-08 DIAGNOSIS — R52 Pain, unspecified: Secondary | ICD-10-CM

## 2021-07-08 LAB — POCT URINALYSIS DIP (MANUAL ENTRY)
Blood, UA: NEGATIVE
Glucose, UA: NEGATIVE mg/dL
Leukocytes, UA: NEGATIVE
Nitrite, UA: NEGATIVE
Protein Ur, POC: 30 mg/dL — AB
Spec Grav, UA: >=1.03 — AB (ref 1.010–1.025)
Urobilinogen, UA: 0.2 E.U./dL
pH, UA: 5.5 (ref 5.0–8.0)

## 2021-07-08 MED ORDER — ONDANSETRON 4 MG PO TBDP: 4.0000 mg | ORAL_TABLET | Freq: Three times a day (TID) | ORAL | 0 refills | Status: AC | PRN

## 2021-07-08 NOTE — Discharge Instructions (Addendum)
I have placed orders to have an x-ray done at the med center in [facility].  Please had there now.  I will call you with your results and update our treatment plan if necessary after I get the report.  Please wait to go pick up your prescriptions for any medications I have prescribed for you until after you complete the x-ray.   Make sure you push fluids drinking mostly water but mix it with Gatorade.  Try to eat light meals including soups, broths and soft foods, fruits.  You may use Zofran for your nausea and vomiting once every 8 hours.  Imodium can help with diarrhea but use this carefully limiting it to 1-2 times per day only if you are having a lot of diarrhea.  Please return to the clinic if symptoms worsen or you start having severe abdominal pain not helped by taking Tylenol or start having bloody stools or blood in the vomit.

## 2021-07-08 NOTE — ED Triage Notes (Addendum)
Pt c/o chills, and diarrhea since Sunday night. She had been having vomiting since today.  Home interventions: Pedialyte, imodium and tylenol

## 2021-07-08 NOTE — ED Provider Notes (Signed)
[facility]   MRN: [id number] DOB: 28-Jun-1940  Subjective:   Dana Watson is a 81 y.o. female with past medical history of osteoporosis presenting for 2 to 3-day history of acute onset chills, malaise, fatigue, diarrhea, loose stools, fevers and body aches.  Unfortunately she started vomiting today.  Has been using Imodium, Tylenol and Pedialyte.  No headache, confusion, dizziness, cough, chest pain, shortness of breath, wheezing.  No history of asthma or respiratory disorders.  Patient is non-smoker.  No current facility-administered medications for this encounter.  Current Outpatient Medications:    acetaminophen (TYLENOL) 325 MG tablet, 1 tablet as needed, Disp: , Rfl:    alendronate (FOSAMAX) 70 MG tablet, 1 tablet, Disp: , Rfl:    Calcium Carb-Cholecalciferol (CALCIUM 500 +D) 500-400 MG-UNIT TABS, 1 tablet with a meal, Disp: , Rfl:    Chromium Picolinate 800 MCG TABS, 1 tablet, Disp: , Rfl:    Zinc 50 MG TABS, 1 tablet, Disp: , Rfl:    Allergies  Allergen Reactions   Penicillin G     Other reaction(s): throat closed    History reviewed. No pertinent past medical history.   History reviewed. No pertinent surgical history.  History reviewed. No pertinent family history.  Social History   Tobacco Use   Smoking status: Never   Smokeless tobacco: Never  Vaping Use   Vaping Use: Never used  Substance Use Topics   Drug use: Never    ROS   Objective:   Vitals: BP (!) 104/55 (BP Location: Right Arm)   Pulse 88   Temp 99.3 F (37.4 C) (Oral)   Resp 18   SpO2 93%   Physical Exam Constitutional:      General: She is not in acute distress.    Appearance: Normal appearance. She is well-developed. She is not ill-appearing, toxic-appearing or diaphoretic.  HENT:     Head: Normocephalic and atraumatic.     Nose: Nose normal. No congestion or rhinorrhea.     Mouth/Throat:     Mouth: Mucous membranes are moist.     Pharynx: No oropharyngeal  exudate or posterior oropharyngeal erythema.  Eyes:     General: No scleral icterus.       Right eye: No discharge.        Left eye: No discharge.     Extraocular Movements: Extraocular movements intact.     Conjunctiva/sclera: Conjunctivae normal.  Cardiovascular:     Rate and Rhythm: Normal rate and regular rhythm.     Heart sounds: Normal heart sounds. No murmur heard.   No friction rub. No gallop.  Pulmonary:     Effort: Pulmonary effort is normal. No respiratory distress.     Breath sounds: No stridor. No wheezing, rhonchi or rales.  Chest:     Chest wall: No tenderness.  Abdominal:     General: Bowel sounds are normal. There is no distension.     Palpations: Abdomen is soft. There is no mass.     Tenderness: There is abdominal tenderness (lower abdomen). There is no right CVA tenderness, left CVA tenderness, guarding or rebound.  Skin:    General: Skin is warm and dry.  Neurological:     General: No focal deficit present.     Mental Status: She is alert and oriented to person, place, and time.  Psychiatric:        Mood and Affect: Mood normal.        Behavior: Behavior normal.  Thought Content: Thought content normal.        Judgment: Judgment normal.    Results for orders placed or performed during the hospital encounter of 07/08/21 (from the past 24 hour(s))  POCT urinalysis dipstick     Status: Abnormal   Collection Time: 07/08/21  3:28 PM  Result Value Ref Range   Color, UA yellow yellow   Clarity, UA clear clear   Glucose, UA negative negative mg/dL   Bilirubin, UA small (A) negative   Ketones, POC UA trace (5) (A) negative mg/dL   Spec Grav, UA >=6.045 (A) 1.010 - 1.025   Blood, UA negative negative   pH, UA 5.5 5.0 - 8.0   Protein Ur, POC =30 (A) negative mg/dL   Urobilinogen, UA 0.2 0.2 or 1.0 E.U./dL   Nitrite, UA Negative Negative   Leukocytes, UA Negative Negative     Assessment and Plan :   PDMP not reviewed this encounter.  1. Acute  viral syndrome   2. Body aches   3. Nausea vomiting and diarrhea   4. Hypoxia    Will pursue outpatient imaging, x-ray order placed. COVID and flu test pending.  We will otherwise manage for an acute viral syndrome, viral gastroenteritis, viral colitis and possible COVID, flu.  Physical exam findings reassuring and vital signs stable for discharge. Advised supportive care, offered symptomatic relief. Counseled patient on potential for adverse effects with medications prescribed/recommended today, ER and return-to-clinic precautions discussed, patient verbalized understanding.    We will update treatment plan as necessary based off of the x-ray results.   Wallis Bamberg, [place] 07/08/21 1546

## 2021-07-10 LAB — COVID-19, FLU A+B NAA
Influenza A, NAA: NOT DETECTED
Influenza B, NAA: NOT DETECTED
SARS-CoV-2, NAA: NOT DETECTED

## 2021-07-16 DIAGNOSIS — H25812 Combined forms of age-related cataract, left eye: Secondary | ICD-10-CM | POA: Diagnosis not present

## 2021-07-25 DIAGNOSIS — H25812 Combined forms of age-related cataract, left eye: Secondary | ICD-10-CM | POA: Diagnosis not present

## 2021-07-25 DIAGNOSIS — H269 Unspecified cataract: Secondary | ICD-10-CM | POA: Diagnosis not present

## 2021-08-08 DIAGNOSIS — H25811 Combined forms of age-related cataract, right eye: Secondary | ICD-10-CM | POA: Diagnosis not present

## 2021-08-08 DIAGNOSIS — H269 Unspecified cataract: Secondary | ICD-10-CM | POA: Diagnosis not present

## 2021-11-04 DIAGNOSIS — Z1231 Encounter for screening mammogram for malignant neoplasm of breast: Secondary | ICD-10-CM | POA: Diagnosis not present

## 2021-12-02 DIAGNOSIS — Z23 Encounter for immunization: Secondary | ICD-10-CM | POA: Diagnosis not present

## 2022-03-23 DIAGNOSIS — G2581 Restless legs syndrome: Secondary | ICD-10-CM | POA: Diagnosis not present

## 2022-03-23 DIAGNOSIS — Z0000 Encounter for general adult medical examination without abnormal findings: Secondary | ICD-10-CM | POA: Diagnosis not present

## 2022-03-23 DIAGNOSIS — M810 Age-related osteoporosis without current pathological fracture: Secondary | ICD-10-CM | POA: Diagnosis not present

## 2022-03-23 DIAGNOSIS — Z23 Encounter for immunization: Secondary | ICD-10-CM | POA: Diagnosis not present

## 2022-03-23 DIAGNOSIS — I493 Ventricular premature depolarization: Secondary | ICD-10-CM | POA: Diagnosis not present

## 2022-03-23 DIAGNOSIS — M1990 Unspecified osteoarthritis, unspecified site: Secondary | ICD-10-CM | POA: Diagnosis not present

## 2022-08-19 DIAGNOSIS — M1990 Unspecified osteoarthritis, unspecified site: Secondary | ICD-10-CM | POA: Diagnosis not present

## 2022-08-19 DIAGNOSIS — M810 Age-related osteoporosis without current pathological fracture: Secondary | ICD-10-CM | POA: Diagnosis not present

## 2022-08-19 DIAGNOSIS — Z9849 Cataract extraction status, unspecified eye: Secondary | ICD-10-CM | POA: Diagnosis not present

## 2022-11-08 IMAGING — DX DG CHEST 2V
2 series · 2 of 2 positions shown · non-contrast
Comparison: None Available.

CLINICAL DATA: Hypoxia

EXAM:
CHEST - 2 VIEW

[chest lat]
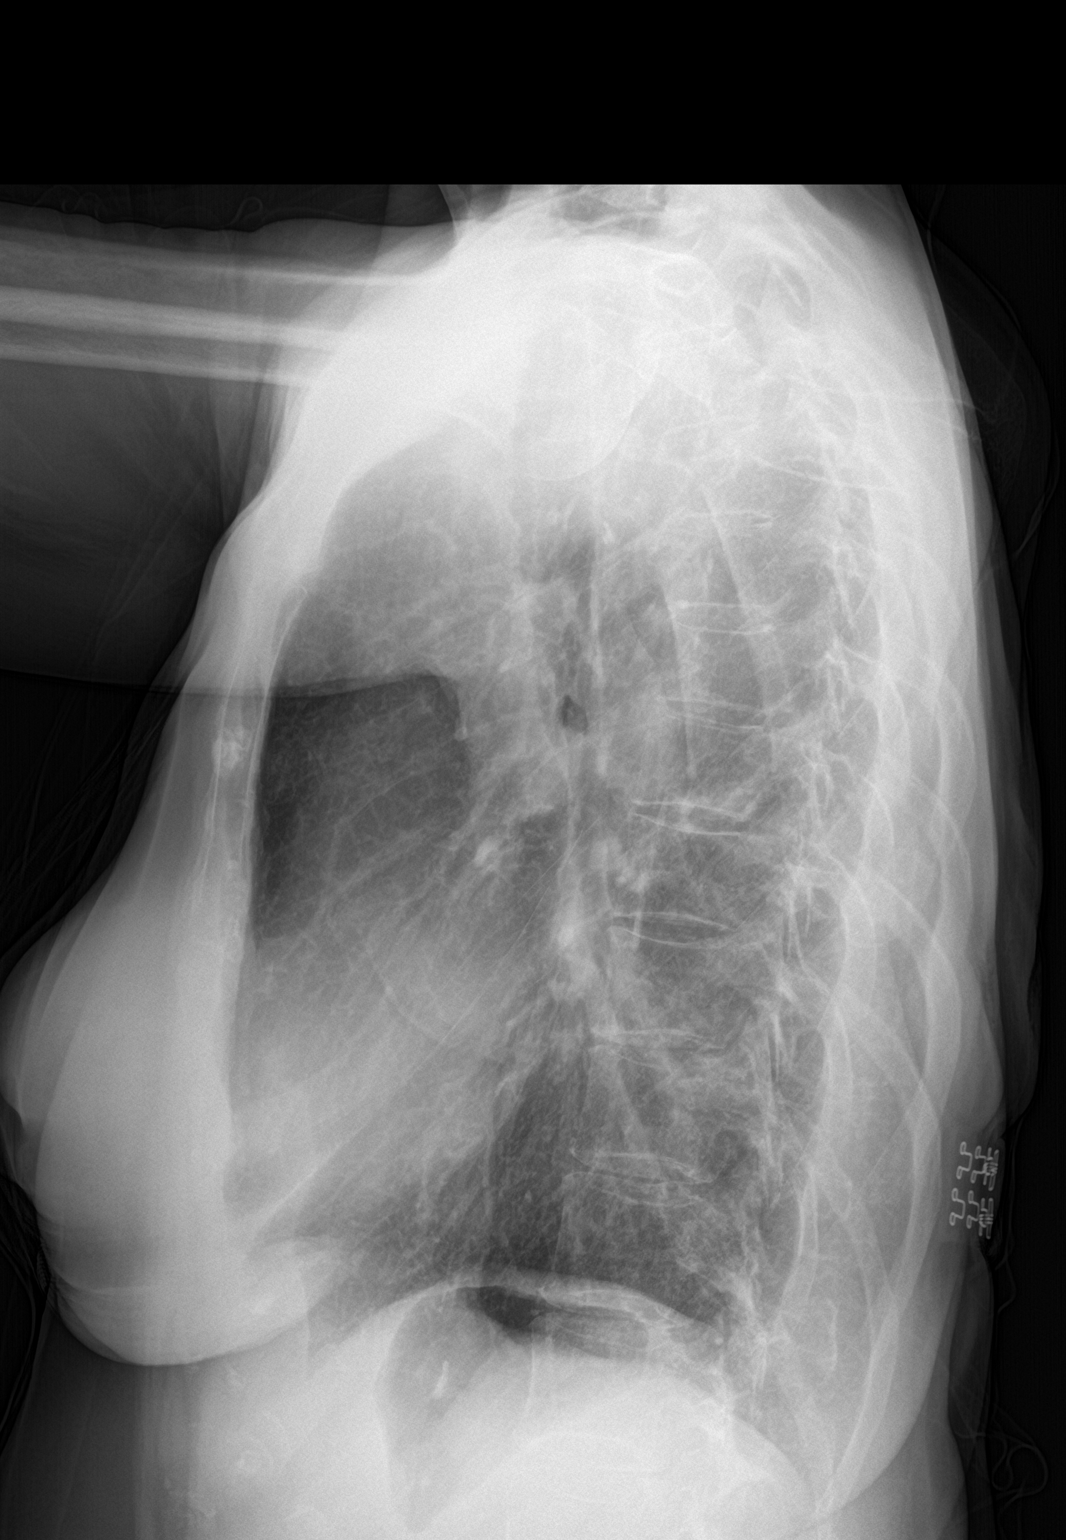

[chest pa]
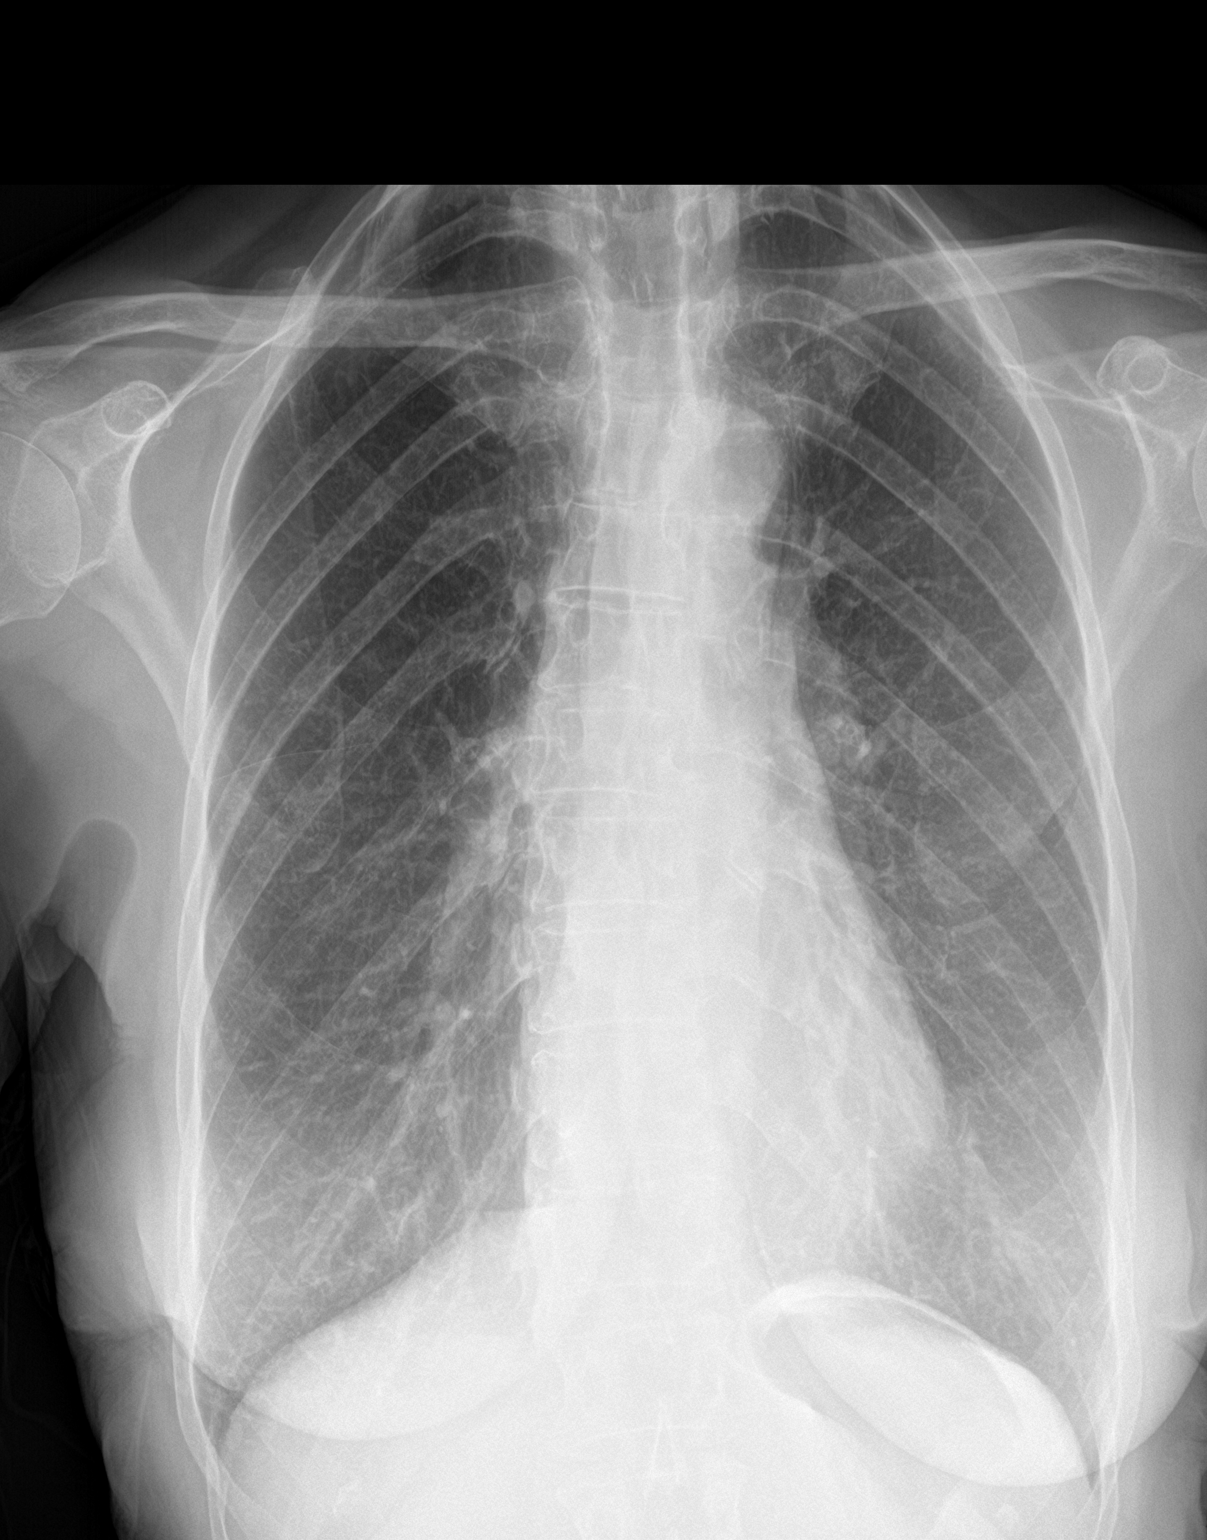

[2 of 2 positions shown; findings below may reference images not displayed]

FINDINGS: The heart size and mediastinal contours are within normal limits.
Both lungs are clear. The visualized skeletal structures are
unremarkable.
IMPRESSION: No active cardiopulmonary disease.

## 2022-11-09 DIAGNOSIS — M8588 Other specified disorders of bone density and structure, other site: Secondary | ICD-10-CM | POA: Diagnosis not present

## 2022-11-09 DIAGNOSIS — Z1231 Encounter for screening mammogram for malignant neoplasm of breast: Secondary | ICD-10-CM | POA: Diagnosis not present

## 2022-11-09 DIAGNOSIS — Z8262 Family history of osteoporosis: Secondary | ICD-10-CM | POA: Diagnosis not present

## 2022-11-26 DIAGNOSIS — Z23 Encounter for immunization: Secondary | ICD-10-CM | POA: Diagnosis not present

## 2023-08-16 DIAGNOSIS — M810 Age-related osteoporosis without current pathological fracture: Secondary | ICD-10-CM | POA: Diagnosis not present

## 2023-08-16 DIAGNOSIS — M1990 Unspecified osteoarthritis, unspecified site: Secondary | ICD-10-CM | POA: Diagnosis not present

## 2023-09-10 DIAGNOSIS — M810 Age-related osteoporosis without current pathological fracture: Secondary | ICD-10-CM | POA: Diagnosis not present

## 2023-09-10 DIAGNOSIS — M1990 Unspecified osteoarthritis, unspecified site: Secondary | ICD-10-CM | POA: Diagnosis not present

## 2023-09-10 DIAGNOSIS — M8580 Other specified disorders of bone density and structure, unspecified site: Secondary | ICD-10-CM | POA: Diagnosis not present

## 2023-09-10 DIAGNOSIS — G8929 Other chronic pain: Secondary | ICD-10-CM | POA: Diagnosis not present

## 2023-09-10 DIAGNOSIS — R32 Unspecified urinary incontinence: Secondary | ICD-10-CM | POA: Diagnosis not present

## 2023-09-16 DIAGNOSIS — M810 Age-related osteoporosis without current pathological fracture: Secondary | ICD-10-CM | POA: Diagnosis not present

## 2023-09-16 DIAGNOSIS — M1990 Unspecified osteoarthritis, unspecified site: Secondary | ICD-10-CM | POA: Diagnosis not present

## 2023-10-17 DIAGNOSIS — M810 Age-related osteoporosis without current pathological fracture: Secondary | ICD-10-CM | POA: Diagnosis not present

## 2023-10-17 DIAGNOSIS — M1990 Unspecified osteoarthritis, unspecified site: Secondary | ICD-10-CM | POA: Diagnosis not present

## 2023-10-30 DIAGNOSIS — Z23 Encounter for immunization: Secondary | ICD-10-CM | POA: Diagnosis not present

## 2023-11-15 DIAGNOSIS — Z1231 Encounter for screening mammogram for malignant neoplasm of breast: Secondary | ICD-10-CM | POA: Diagnosis not present

## 2023-11-16 DIAGNOSIS — M1990 Unspecified osteoarthritis, unspecified site: Secondary | ICD-10-CM | POA: Diagnosis not present

## 2023-11-16 DIAGNOSIS — M810 Age-related osteoporosis without current pathological fracture: Secondary | ICD-10-CM | POA: Diagnosis not present

## 2023-12-07 DIAGNOSIS — J040 Acute laryngitis: Secondary | ICD-10-CM | POA: Diagnosis not present

## 2023-12-17 DIAGNOSIS — M810 Age-related osteoporosis without current pathological fracture: Secondary | ICD-10-CM | POA: Diagnosis not present

## 2023-12-17 DIAGNOSIS — M1990 Unspecified osteoarthritis, unspecified site: Secondary | ICD-10-CM | POA: Diagnosis not present

## 2024-01-16 DIAGNOSIS — M1990 Unspecified osteoarthritis, unspecified site: Secondary | ICD-10-CM | POA: Diagnosis not present

## 2024-01-16 DIAGNOSIS — M810 Age-related osteoporosis without current pathological fracture: Secondary | ICD-10-CM | POA: Diagnosis not present

## 2024-02-27 ENCOUNTER — Emergency Department (HOSPITAL_COMMUNITY)

## 2024-02-27 ENCOUNTER — Observation Stay (HOSPITAL_COMMUNITY)
Admission: EM | Admit: 2024-02-27 | Discharge: 2024-02-28 | Disposition: A | Attending: Family Medicine | Admitting: Family Medicine

## 2024-02-27 ENCOUNTER — Other Ambulatory Visit: Payer: Self-pay

## 2024-02-27 ENCOUNTER — Encounter (HOSPITAL_COMMUNITY): Payer: Self-pay

## 2024-02-27 DIAGNOSIS — R4781 Slurred speech: Secondary | ICD-10-CM | POA: Diagnosis present

## 2024-02-27 DIAGNOSIS — Z7982 Long term (current) use of aspirin: Secondary | ICD-10-CM | POA: Diagnosis not present

## 2024-02-27 DIAGNOSIS — R2689 Other abnormalities of gait and mobility: Secondary | ICD-10-CM | POA: Insufficient documentation

## 2024-02-27 DIAGNOSIS — G459 Transient cerebral ischemic attack, unspecified: Secondary | ICD-10-CM | POA: Insufficient documentation

## 2024-02-27 DIAGNOSIS — R471 Dysarthria and anarthria: Secondary | ICD-10-CM | POA: Diagnosis present

## 2024-02-27 DIAGNOSIS — M818 Other osteoporosis without current pathological fracture: Secondary | ICD-10-CM | POA: Diagnosis not present

## 2024-02-27 DIAGNOSIS — M810 Age-related osteoporosis without current pathological fracture: Secondary | ICD-10-CM | POA: Insufficient documentation

## 2024-02-27 DIAGNOSIS — R946 Abnormal results of thyroid function studies: Secondary | ICD-10-CM | POA: Insufficient documentation

## 2024-02-27 DIAGNOSIS — Z79899 Other long term (current) drug therapy: Secondary | ICD-10-CM | POA: Diagnosis not present

## 2024-02-27 DIAGNOSIS — I10 Essential (primary) hypertension: Secondary | ICD-10-CM

## 2024-02-27 DIAGNOSIS — R112 Nausea with vomiting, unspecified: Secondary | ICD-10-CM | POA: Diagnosis present

## 2024-02-27 DIAGNOSIS — I4581 Long QT syndrome: Secondary | ICD-10-CM | POA: Diagnosis not present

## 2024-02-27 DIAGNOSIS — I161 Hypertensive emergency: Secondary | ICD-10-CM | POA: Diagnosis not present

## 2024-02-27 DIAGNOSIS — I169 Hypertensive crisis, unspecified: Secondary | ICD-10-CM | POA: Diagnosis not present

## 2024-02-27 DIAGNOSIS — R2990 Unspecified symptoms and signs involving the nervous system: Secondary | ICD-10-CM

## 2024-02-27 DIAGNOSIS — I160 Hypertensive urgency: Secondary | ICD-10-CM | POA: Diagnosis not present

## 2024-02-27 HISTORY — DX: Age-related osteoporosis without current pathological fracture: M81.0

## 2024-02-27 LAB — COMPREHENSIVE METABOLIC PANEL WITH GFR
ALT: 14 U/L (ref 0–44)
AST: 29 U/L (ref 15–41)
Albumin: 4.2 g/dL (ref 3.5–5.0)
Alkaline Phosphatase: 60 U/L (ref 38–126)
Anion gap: 11 (ref 5–15)
BUN: 14 mg/dL (ref 8–23)
CO2: 26 mmol/L (ref 22–32)
Calcium: 9.7 mg/dL (ref 8.9–10.3)
Chloride: 102 mmol/L (ref 98–111)
Creatinine, Ser: 0.82 mg/dL (ref 0.44–1.00)
GFR, Estimated: >60 mL/min
Glucose, Bld: 96 mg/dL (ref 70–99)
Potassium: 3.8 mmol/L (ref 3.5–5.1)
Sodium: 139 mmol/L (ref 135–145)
Total Bilirubin: 0.4 mg/dL (ref 0.0–1.2)
Total Protein: 7.3 g/dL (ref 6.5–8.1)

## 2024-02-27 LAB — DIFFERENTIAL
Abs Immature Granulocytes: 0.03 K/uL (ref 0.00–0.07)
Basophils Absolute: 0 K/uL (ref 0.0–0.1)
Basophils Relative: 1 %
Eosinophils Absolute: 0.3 K/uL (ref 0.0–0.5)
Eosinophils Relative: 5 %
Immature Granulocytes: 1 %
Lymphocytes Relative: 42 %
Lymphs Abs: 2.7 K/uL (ref 0.7–4.0)
Monocytes Absolute: 0.6 K/uL (ref 0.1–1.0)
Monocytes Relative: 9 %
Neutro Abs: 2.8 K/uL (ref 1.7–7.7)
Neutrophils Relative %: 42 %

## 2024-02-27 LAB — I-STAT CHEM 8, ED
BUN: 16 mg/dL (ref 8–23)
Calcium, Ion: 1.11 mmol/L — ABNORMAL LOW (ref 1.15–1.40)
Chloride: 104 mmol/L (ref 98–111)
Creatinine, Ser: 0.8 mg/dL (ref 0.44–1.00)
Glucose, Bld: 93 mg/dL (ref 70–99)
HCT: 40 % (ref 36.0–46.0)
Hemoglobin: 13.6 g/dL (ref 12.0–15.0)
Potassium: 4 mmol/L (ref 3.5–5.1)
Sodium: 141 mmol/L (ref 135–145)
TCO2: 26 mmol/L (ref 22–32)

## 2024-02-27 LAB — CBC
HCT: 41 % (ref 36.0–46.0)
Hemoglobin: 13.2 g/dL (ref 12.0–15.0)
MCH: 28.9 pg (ref 26.0–34.0)
MCHC: 32.2 g/dL (ref 30.0–36.0)
MCV: 89.9 fL (ref 80.0–100.0)
Platelets: 204 K/uL (ref 150–400)
RBC: 4.56 MIL/uL (ref 3.87–5.11)
RDW: 13.5 % (ref 11.5–15.5)
WBC: 6.5 K/uL (ref 4.0–10.5)
nRBC: 0 % (ref 0.0–0.2)

## 2024-02-27 LAB — PROTIME-INR
INR: 1 (ref 0.8–1.2)
Prothrombin Time: 13.4 s (ref 11.4–15.2)

## 2024-02-27 LAB — URINE DRUG SCREEN
Amphetamines: NEGATIVE
Barbiturates: NEGATIVE
Benzodiazepines: NEGATIVE
Cocaine: NEGATIVE
Fentanyl: NEGATIVE
Methadone Scn, Ur: NEGATIVE
Opiates: NEGATIVE
Tetrahydrocannabinol: NEGATIVE

## 2024-02-27 LAB — ETHANOL: Alcohol, Ethyl (B): <15 mg/dL

## 2024-02-27 LAB — TROPONIN T, HIGH SENSITIVITY: Troponin T High Sensitivity: <15 ng/L (ref 0–19)

## 2024-02-27 LAB — APTT: aPTT: 28 s (ref 24–36)

## 2024-02-27 LAB — CBG MONITORING, ED: Glucose-Capillary: 82 mg/dL (ref 70–99)

## 2024-02-27 MED ORDER — ONDANSETRON HCL 4 MG/2ML IJ SOLN: 4.0000 mg | Freq: Four times a day (QID) | INTRAMUSCULAR | Status: DC | PRN

## 2024-02-27 MED ORDER — PROCHLORPERAZINE EDISYLATE 10 MG/2ML IJ SOLN
10.0000 mg | Freq: Four times a day (QID) | INTRAMUSCULAR | Status: DC | PRN
  Administered 2024-02-27: 10 mg via INTRAVENOUS
  Filled 2024-02-27: qty 2

## 2024-02-27 MED ORDER — HYDRALAZINE HCL 20 MG/ML IJ SOLN: 10.0000 mg | INTRAMUSCULAR | Status: DC | PRN

## 2024-02-27 MED ORDER — ACETAMINOPHEN 650 MG RE SUPP: 650.0000 mg | Freq: Four times a day (QID) | RECTAL | Status: DC | PRN

## 2024-02-27 MED ORDER — ONDANSETRON HCL 4 MG/2ML IJ SOLN
4.0000 mg | Freq: Once | INTRAMUSCULAR | Status: AC
  Administered 2024-02-27: 4 mg via INTRAVENOUS
  Filled 2024-02-27: qty 2

## 2024-02-27 MED ORDER — AMLODIPINE BESYLATE 5 MG PO TABS
5.0000 mg | ORAL_TABLET | Freq: Every day | ORAL | Status: DC
  Administered 2024-02-28: 5 mg via ORAL
  Filled 2024-02-27: qty 1

## 2024-02-27 MED ORDER — LACTATED RINGERS IV BOLUS
1000.0000 mL | Freq: Once | INTRAVENOUS | Status: AC
  Administered 2024-02-27: 1000 mL via INTRAVENOUS

## 2024-02-27 MED ORDER — AMLODIPINE BESYLATE 5 MG PO TABS
5.0000 mg | ORAL_TABLET | Freq: Once | ORAL | Status: AC
  Administered 2024-02-27: 5 mg via ORAL
  Filled 2024-02-27: qty 1

## 2024-02-27 MED ORDER — ACETAMINOPHEN 325 MG PO TABS: 650.0000 mg | ORAL_TABLET | Freq: Four times a day (QID) | ORAL | Status: DC | PRN

## 2024-02-27 MED ORDER — MELATONIN 3 MG PO TABS: 3.0000 mg | ORAL_TABLET | Freq: Every evening | ORAL | Status: DC | PRN

## 2024-02-27 NOTE — ED Triage Notes (Signed)
 Pt BIB EMS with c/o slurred speech, dizzines, headache. Son saw her at 1500 and noticed her speech started slurring and pt states she didn't feel right. Slurred speech resolved after about 30 minutes. BP with EMS 200/120 with no hx of HTN. On arrival, pt still complaining of lightheadedness and not feeling right. Pt scored 0 NIH with EDP.  While nurse getting blood work, pt states not feeling right again and slurred speech begun again. NEW LKW 1700, symptoms starting again 1701, Code Stroke called.

## 2024-02-27 NOTE — Consult Note (Addendum)
 NEUROLOGY CONSULT NOTE   Date of service: February 27, 2024 Patient Name: Dana Watson MRN:  [id number] DOB:  01-20-1941 Chief Complaint: Code stroke  Requesting Provider: Yolande Lamar BROCKS, MD  History of Present Illness  Dana Watson is a 84 y.o. female with no past medical history presents to [facility] ED for evaluation of acute onset of slurred speech. First episode of slurred speech occurred around 3pm and lasted about 30 minutes with a c/o not feeling well. She was in the ED for evaluation when she suddenly had another episode of acute onset of slurred speech. She denies any headache, weakness, numbness/tingling, facial droop or vision changes. LKW 1700. Code stroke activated in the ED. She is noted to be hypertensive in the ED @ 197/87, CBG 82. CT head with no acute process. Patient taken to MRI STAT for treatment decision. NIHSS 1 CT head with no acute process.   LKW: 1700 Modified rankin score: 0-Completely asymptomatic and back to baseline post- stroke IV Thrombolysis:  No mild symptoms and improving  EVT:  No LVO    NIHSS components Score: Comment  1a Level of Conscious 0[]  1[]  2[]  3[]      1b LOC Questions 0[]  1[]  2[]       1c LOC Commands 0[]  1[]  2[]       2 Best Gaze 0[]  1[]  2[]       3 Visual 0[]  1[]  2[]  3[]      4 Facial Palsy 0[]  1[]  2[]  3[]      5a Motor Arm - left 0[]  1[]  2[]  3[]  4[]  UN[]    5b Motor Arm - Right 0[]  1[]  2[]  3[]  4[]  UN[]    6a Motor Leg - Left 0[]  1[]  2[]  3[]  4[]  UN[]    6b Motor Leg - Right 0[]  1[]  2[]  3[]  4[]  UN[]    7 Limb Ataxia 0[]  1[]  2[]  UN[]      8 Sensory 0[]  1[]  2[]  UN[]      9 Best Language 0[]  1[]  2[]  3[]      10 Dysarthria 0[]  1[x]  2[]  UN[]      11 Extinct. and Inattention 0[]  1[]  2[]       TOTAL: 1      ROS  Comprehensive ROS performed and pertinent positives documented in HPI    Past History  No past medical history on file.  No past surgical history on file.  Family History: No family history on file.  Social History  reports that she  has never smoked. She has never used smokeless tobacco. She reports that she does not use drugs. No history on file for alcohol use.  Allergies[1]  Medications  Current Medications[2]  Vitals   There were no vitals filed for this visit.  There is no height or weight on file to calculate BMI.   Physical Exam   Constitutional: Appears well-developed and well-nourished.  Psych: Affect appropriate to situation.  Eyes: No scleral injection.  HENT: No OP obstruction.  Head: Normocephalic.  Cardiovascular: Normal rate and regular rhythm.  Respiratory: Effort normal, non-labored breathing.  GI: Soft.  No distension. There is no tenderness.  Skin: WDI.   Neurologic Examination   Mental Status -  Level of arousal and orientation to time, place, and person were intact. Language including expression, naming, repetition, comprehension was assessed and found intact. Mild dysarthria  Attention span and concentration were normal. Recent and remote memory were intact. Fund of Knowledge was assessed and was intact.  Cranial Nerves II - XII - II - Visual field intact OU . III, IV,  VI - Extraocular movements intact . V - Facial sensation intact bilaterally . VII - Facial movement intact bilaterally . VIII - Hearing & vestibular intact bilaterally . X - Palate elevates symmetrically . XI - Chin turning & shoulder shrug intact bilaterally . XII - Tongue protrusion intact .  Motor Strength - The patients strength was normal in all extremities and pronator drift was absent.  Bulk was normal and fasciculations were absent .   Motor Tone - Muscle tone was assessed at the neck and appendages and was normal . Sensory - Light touch, temperature/pinprick were assessed and were symmetrical.   Coordination - The patient had normal movements in the hands and feet with no ataxia or dysmetria.  Tremor was absent.  Gait and Station - deferred.  Labs/Imaging/Neurodiagnostic studies   CBC:  Recent  Labs  Lab 02/27/24 1714  HGB 13.6  HCT 40.0   Basic Metabolic Panel:  Lab Results  Component Value Date   NA 141 02/27/2024   K 4.0 02/27/2024   GLUCOSE 93 02/27/2024   BUN 16 02/27/2024   CREATININE 0.80 02/27/2024   Lipid Panel: No results found for: LDLCALC HgbA1c: No results found for: HGBA1C Urine Drug Screen: No results found for: LABOPIA, COCAINSCRNUR, LABBENZ, AMPHETMU, THCU, LABBARB  Alcohol Level No results found for: ETH INR No results found for: INR APTT No results found for: APTT AED levels: No results found for: PHENYTOIN, ZONISAMIDE, LAMOTRIGINE, LEVETIRACETA  CT Head without contrast(Personally reviewed): No acute process   MRI Brain(Personally reviewed): No acute process   ASSESSMENT   Dana Watson is a 84 y.o. female  no past medical history presents to [facility] ED for evaluation of acute onset of slurred speech. First episode of slurred speech occurred around 3pm and lasted about 30 minutes with a c/o not feeling well. She was in the ED for evaluation when she suddenly had another episode of acute onset of slurred speech. She denies any weakness, numbness/tingling, facial droop or vision changes. LKW 1700. Code stroke activated in the ED.  RECOMMENDATIONS   Manage HTN.  Neurology will sign off. ______________________________________________________________________    Signed, Karna DELENA Geralds, NP [facility]  ATTENDING ATTESTATION:  Pt with HTN had slurred speech then resolved. Returned again and code stoke activated in ER. NIHSS1. CT neg. Stat MRI neg.  Code cancelled.  Likely hypertensive urgency.  Discussed with ED.    Dr. Nichola evaluated pt independently, reviewed imaging, chart, labs. Discussed and formulated plan with the Resident/APP. Changes were made to the note where appropriate. Please see APP/resident note above for details.  MDM: Deneise. Pertinent labs, imaging results reviewed by me and considered  in my decision making. Independently reviewed imaging. Medical records reviewed. Discussed the patient with another medical provider/personnel. Obtained history from someone other than the patient.     Desmond Szabo,MD      [1]  Allergies Allergen Reactions   Penicillin G     Other reaction(s): throat closed  [2] No current facility-administered medications for this encounter.  Current Outpatient Medications:    acetaminophen  (TYLENOL ) 325 MG tablet, 1 tablet as needed, Disp: , Rfl:    alendronate (FOSAMAX) 70 MG tablet, 1 tablet, Disp: , Rfl:    Calcium  Carb-Cholecalciferol (CALCIUM  500 +D) 500-400 MG-UNIT TABS, 1 tablet with a meal, Disp: , Rfl:    Chromium Picolinate 800 MCG TABS, 1 tablet, Disp: , Rfl:    ondansetron  (ZOFRAN -ODT) 4 MG disintegrating tablet, Take 1 tablet (4 mg total) by mouth  every 8 (eight) hours as needed for nausea or vomiting., Disp: 20 tablet, Rfl: 0   Zinc 50 MG TABS, 1 tablet, Disp: , Rfl:

## 2024-02-27 NOTE — ED Provider Notes (Signed)
" [place] [facility] AT [facility] Provider Note   CSN: [id number] Arrival date & time: 02/27/24  1652  An emergency department physician performed an initial assessment on this suspected stroke patient at 1701.  Patient presents with: Code Stroke   Dana Watson is a 84 y.o. female.   84 year old female no relevant PMH presents emergency department slurred speech.  Patient reports that she was last known well at 1330 today.  Had approximately a 30-minute episode of slurred speech and 911 was called.  It resolved by the time they had arrived.  Also reported feeling lightheaded but no vertiginous sensation.  Says that she is feeling asymptomatic at this point in time.       Prior to Admission medications  Medication Sig Start Date End Date Taking? Authorizing Provider  acetaminophen  (TYLENOL ) 325 MG tablet Take 325 mg by mouth every 4 (four) hours as needed for moderate pain (pain score 4-6).   Yes [provider]  alendronate (FOSAMAX) 70 MG tablet Take 70 mg by mouth once a week.   Yes [provider]  Calcium  Carb-Cholecalciferol (CALCIUM  500 +D) 500-400 MG-UNIT TABS 1 tablet with a meal   Yes [provider]  Chromium Picolinate 800 MCG TABS Take 1 tablet by mouth daily.   Yes [provider]  diclofenac Sodium (VOLTAREN) 1 % GEL Apply 2 g topically 4 (four) times daily.   Yes [provider]  Multiple Vitamins-Minerals (MULTIVITAMIN WITH MINERALS) tablet Take 1 tablet by mouth daily.   Yes [provider]  Zinc 50 MG TABS Take 1 tablet by mouth daily.   Yes [provider]  ondansetron  (ZOFRAN -ODT) 4 MG disintegrating tablet Take 1 tablet (4 mg total) by mouth every 8 (eight) hours as needed for nausea or vomiting. Patient not taking: Reported on 02/27/2024 07/08/21   Christopher Savannah, PA-C    Allergies: Penicillin g    Review of Systems  Updated Vital Signs BP (!) 158/74 (BP Location: Left Arm)    Pulse (!) 51   Temp (!) 97.5 F (36.4 C) (Oral)   Resp 16   Ht 5' 9 (1.753 m)   Wt 64.4 kg   SpO2 100%   BMI 20.97 kg/m   Physical Exam Constitutional:      Appearance: Normal appearance.  Eyes:     Extraocular Movements: Extraocular movements intact.     Conjunctiva/sclera: Conjunctivae normal.     Pupils: Pupils are equal, round, and reactive to light.  Cardiovascular:     Rate and Rhythm: Normal rate and regular rhythm.     Pulses: Normal pulses.     Heart sounds: Normal heart sounds. No murmur heard. Pulmonary:     Effort: Pulmonary effort is normal.     Breath sounds: Normal breath sounds.  Neurological:     Mental Status: She is alert.     Comments: NIHSS Exam  Level of Consciousness: Alert  LOC Questions: Answers Month and Age Correctly  LOC Commands: Opens and Closes Eyes and Hands on command  Best Gaze: Horizontal ocular movements intact  Visual Fields: No visual field loss  Facial Palsy: None  L Upper Extremity Motor: No drift after 10 seconds  R Upper Extremity Motor: No drift after 10 seconds  L Lower extremity Motor: No drift after 5 seconds  R Lower extremity Motor: No drift after 5 seconds  Ataxia: Absent  Sensory: Intact sensation to light touch on face, arms, trunk, and legs bilaterally  Best  Language: No aphasia  Dysarthria: No dysarthria  Neglect: No visual or sensory neglect        (all labs ordered are listed, but only abnormal results are displayed) Labs Reviewed  I-STAT CHEM 8, ED - Abnormal; Notable for the following components:      Result Value   Calcium , Ion 1.11 (*)    All other components within normal limits  PROTIME-INR  APTT  CBC  DIFFERENTIAL  COMPREHENSIVE METABOLIC PANEL WITH GFR  ETHANOL  URINE DRUG SCREEN  CBG MONITORING, ED  TROPONIN T, HIGH SENSITIVITY  TROPONIN T, HIGH SENSITIVITY    EKG: None  Radiology: MR BRAIN WO CONTRAST Result Date: 02/27/2024 EXAM: MRI BRAIN WITHOUT CONTRAST 02/27/2024 05:58:05 PM  TECHNIQUE: Multiplanar multisequence MRI of the head/brain was performed without the administration of intravenous contrast. COMPARISON: Head CT 02/27/2024. CLINICAL HISTORY: Neuro deficit, acute, stroke suspected. Slurred speech, dizziness, and headache. FINDINGS: BRAIN AND VENTRICLES: There is no evidence of an acute infarct, intracranial hemorrhage, mass, midline shift, hydrocephalus, or extra-axial fluid collection. Patchy T2 hyperintensities in the cerebral white matter and pons are nonspecific but compatible with mild chronic small vessel ischemic disease. Mild cerebral atrophy is within normal limits for age. Major intracranial vascular flow voids are preserved. ORBITS: Bilateral cataract extraction. SINUSES AND MASTOIDS: Moderate right and mild left ethmoid air cell mucosal thickening. Clear mastoid air cells. BONES AND SOFT TISSUES: Normal marrow signal. No significant soft tissue abnormality. IMPRESSION: 1. No acute intracranial abnormality. 2. Mild chronic small vessel ischemic disease. Electronically signed by: Dasie Hamburg MD MD 02/27/2024 06:03 PM EST RP Workstation: [id number]   CT HEAD CODE STROKE WO CONTRAST (LKW 0-4.5h, LVO 0-24h) Result Date: 02/27/2024 EXAM: CT HEAD WITHOUT CONTRAST 02/27/2024 05:18:13 PM TECHNIQUE: CT of the head was performed without the administration of intravenous contrast. Automated exposure control, iterative reconstruction, and/or weight based adjustment of the mA/kV was utilized to reduce the radiation dose to as low as reasonably achievable. COMPARISON: None available. CLINICAL HISTORY: Neurological deficit, acute, stroke suspected. Slurred speech, dizziness, and headache. FINDINGS: BRAIN AND VENTRICLES: There is no evidence of an acute infarct, intracranial hemorrhage, mass, midline shift, hydrocephalus, or extra-axial fluid collection. Cerebral volume is normal for age. Hypodensities in the cerebral white matter bilaterally are nonspecific but compatible with mild  chronic small vessel ischemic disease. Calcified atherosclerosis at the skull base. ORBITS: Bilateral cataract extraction. SINUSES: Moderate right and mild left ethmoid air cell mucosal thickening. Clear mastoid air cells. SOFT TISSUES AND SKULL: No acute soft tissue abnormality. No skull fracture. [place] Stroke Program Early CT Score (ASPECTS) ----- Ganglionic (caudate, IC, lentiform nucleus, insula, M1-M3): 7 Supraganglionic (M4-M6): 3 Total: 10 IMPRESSION: 1. No acute intracranial abnormality. ASPECTS of 10. 2. Mild chronic small vessel ischemic disease. 3. These results were communicated to Dr. JUDITHANN Raddle at 5:30 pm on 02/27/2024 by secure text page via the [facility] messaging system. Electronically signed by: Dasie Hamburg MD MD 02/27/2024 05:31 PM EST RP Workstation: [id number]     Procedures   Medications Ordered in the ED  amLODipine  (NORVASC ) tablet 5 mg (has no administration in time range)  lactated ringers  bolus 1,000 mL (1,000 mLs Intravenous New Bag/Given 02/27/24 1805)  ondansetron  (ZOFRAN ) injection 4 mg (4 mg Intravenous Given 02/27/24 1857)    Clinical Course as of 02/27/24 1936  Sun Feb 27, 2024  1703 Patient now having slurred speech.  Last known well 5 PM when her NIH was performed. [RP]  1716 Dr Raddle at the bedside [RP]  1754 Neurology has reviewed imaging.  No stroke noted.  They feel that this is likely due to her hypertension. [RP]  1930 Discussed with Dr. Marcene for admission. [RP]    Clinical Course User Index [RP] Dana Lamar BROCKS, MD                                 Medical Decision Making Amount and/or Complexity of Data Reviewed Labs: ordered. Radiology: ordered.  Risk Prescription drug management. Decision regarding hospitalization.   Dana Watson is a 84 year old female no relevant past medical history who presents with elevated blood pressure and slurred speech  Initial Ddx:  ICH, stroke, TIA, hypertensive encephalopathy  MDM/Course:  Patient  presents emergency department with difficulty speaking.  Had a transient episode of slurred speech prior to arrival.  When she arrived to the emergency department was hypertensive but had an NIH stroke scale of 0.  Code stroke was not activated.  Shortly after being roomed she had a witnessed episode of difficulty speaking by both myself and the nurse.  Appeared to be more so dysarthria rather than aphasia.  No other deficits at that point in time.  Code stroke was activated.  CT head unremarkable.  MRI without acute stroke.  Neurology felt that this was not a stroke or TIA.  Felt that it was likely related to her elevated blood pressure and feels that we can safely lower her blood pressure at this point in time.  Upon re-evaluation was having some nausea and given Zofran .  Admitted to hospitalist for further management.  This patient presents to the ED for concern of complaints listed in HPI, this involves an extensive number of treatment options, and is a complaint that carries with it a high risk of complications and morbidity. Disposition including potential need for admission considered.   Dispo: Admit to Floor  I have reviewed the patients home medications and made adjustments as needed Additional history obtained from family The following labs were independently interpreted: Chemistry and show no acute abnormality I independently reviewed the following imaging with scope of interpretation limited to determining acute life threatening conditions related to emergency care: CT Head and agree with the radiologist interpretation with the following exceptions: none I personally reviewed and interpreted cardiac monitoring: normal sinus rhythm  I personally reviewed and interpreted the pt's EKG: see above for interpretation  Consults: Hospitalist and Neurology Social Determinants of health:  Geriatric  CRITICAL CARE Performed by: Lamar Watson Dana   Total critical care time: 30  minutes  Critical care time was exclusive of separately billable procedures and treating other patients.  Critical care was necessary to treat or prevent imminent or life-threatening deterioration.  Critical care was time spent personally by me on the following activities: development of treatment plan with patient and/or surrogate as well as nursing, discussions with consultants, evaluation of patient's response to treatment, examination of patient, obtaining history from patient or surrogate, ordering and performing treatments and interventions, ordering and review of laboratory studies, ordering and review of radiographic studies, pulse oximetry and re-evaluation of patient's condition.   Portions of this note were generated with Scientist, clinical (histocompatibility and immunogenetics). Dictation errors may occur despite best attempts at proofreading.     Final diagnoses:  Stroke-like symptoms  Dysarthria  Hypertensive emergency    ED Discharge Orders     None          Dana Lamar BROCKS, MD 02/27/24  1936 ° °"

## 2024-02-27 NOTE — Code Documentation (Signed)
 Stroke Response Nurse Documentation Code Documentation  JAYME MEDNICK is a 84 y.o. female arriving to [facility]  via [facility] on 02-27-2024 with no past medical hx. On No antithrombotic. Code stroke was activated by ED.   Patient from home where she was LKW at 1500 and now complaining of Slurred speech/ her words are not right.  Per Staff her symptoms has resolved upon arrival.  At 1700 her NIHSS was 0.  A few minutes later she again had slurred speech, code stroke was activated.  Stroke team at the bedside on patient arrival. Labs drawn and patient cleared for CT by Dr. Jakie. Patient to CT with team. NIHSS 1, see documentation for details and code stroke times. Patient with dysarthria  on exam. The following imaging was completed:  CT Head and MRI. Patient is not a candidate for IV Thrombolytic due to no CVA on MRI. Patient is not a candidate for IR due to no LVO.   Care Plan: Cancel Code Stroke.    Bedside handoff with ED RN Mitzie.    Elvin Portland  Stroke Response RN

## 2024-02-27 NOTE — H&P (Signed)
 " History and Physical      Dana Watson FMW:[id number] DOB: 1940-10-15 DOA: 02/27/2024; DOS: 02/27/2024  PCP: Leonel Cole, MD *** Patient coming from: home ***  I have personally briefly reviewed patient's old medical records in [facility]  Chief Complaint: ***  HPI: Dana Watson is a 84 y.o. female with medical history significant for *** who is admitted to [facility] on 02/27/2024 with *** after presenting from home*** to [facility] ED complaining of ***.    ***       ***   ED Course:  Vital signs in the ED were notable for the following: ***  Labs were notable for the following: ***  Per my interpretation, EKG in ED demonstrated the following:  ***  Imaging in the ED, per corresponding formal radiology read, was notable for the following:  ***  EDP discussed patient's case with on-call neurology, Dr. Nichola, who is formally consulting, and feels that presentation is less suggestive of TIA, but rather more consistent with hypertensive crisis. Dr. Nichola does not recommend additional TIA workup at this time and conveys no need to observe permissive hypertension, but rather recommends gradual normalization of blood pressure.    While in the ED, the following were administered: ***  Subsequently, the patient was admitted  ***  ***red    Review of Systems: As per HPI otherwise 10 point review of systems negative.   History reviewed. No pertinent past medical history.  History reviewed. No pertinent surgical history.  Social History:  reports that she has never smoked. She has never used smokeless tobacco. She reports that she does not use drugs. No history on file for alcohol use.   Allergies[1]  History reviewed. No pertinent family history.  Family history reviewed and not pertinent ***   Prior to Admission medications  Medication Sig Start Date End Date Taking? Authorizing Provider  acetaminophen  (TYLENOL ) 325 MG tablet Take 325 mg by mouth  every 4 (four) hours as needed for moderate pain (pain score 4-6).   Yes [provider]  alendronate (FOSAMAX) 70 MG tablet Take 70 mg by mouth once a week.   Yes [provider]  Calcium  Carb-Cholecalciferol (CALCIUM  500 +D) 500-400 MG-UNIT TABS 1 tablet with a meal   Yes [provider]  Chromium Picolinate 800 MCG TABS Take 1 tablet by mouth daily.   Yes [provider]  diclofenac Sodium (VOLTAREN) 1 % GEL Apply 2 g topically 4 (four) times daily.   Yes [provider]  Multiple Vitamins-Minerals (MULTIVITAMIN WITH MINERALS) tablet Take 1 tablet by mouth daily.   Yes [provider]  Zinc 50 MG TABS Take 1 tablet by mouth daily.   Yes [provider]  ondansetron  (ZOFRAN -ODT) 4 MG disintegrating tablet Take 1 tablet (4 mg total) by mouth every 8 (eight) hours as needed for nausea or vomiting. Patient not taking: Reported on 02/27/2024 07/08/21   Christopher Savannah, PA-C     Objective    Physical Exam: Vitals:   02/27/24 1727 02/27/24 1855 02/27/24 1935 02/27/24 1951  BP:  (!) 176/69 (!) 158/74 (!) 167/78  Pulse:  60 (!) 51   Resp:  18 16   Temp:   (!) 97.5 F (36.4 C)   TempSrc:   Oral   SpO2:  100% 100%   Weight:      Height: 5' 9 (1.753 m)       General: appears to be stated age; alert, oriented Skin: warm,  dry, no rash Head:  AT/[place] Mouth:  Oral mucosa membranes appear moist, normal dentition Neck: supple; trachea midline Heart:  RRR; did not appreciate any M/R/G Lungs: CTAB, did not appreciate any wheezes, rales, or rhonchi Abdomen: + BS; soft, ND, NT Vascular: 2+ pedal pulses b/l; 2+ radial pulses b/l Extremities: no peripheral edema, no muscle wasting   ***   *** Neuro: strength and sensation intact in upper and lower extremities b/l  *** Neuro: 5/5 strength of the proximal and distal flexors and extensors of the upper and lower extremities bilaterally; sensation intact in upper and lower extremities b/l;  cranial nerves II through XII grossly intact; no pronator drift; no evidence suggestive of slurred speech, dysarthria, or facial droop; Normal muscle tone. No tremors. *** Neuro: In the setting of the patient's current mental status and associated inability to follow instructions, unable to perform full neurologic exam at this time.  As such, assessment of strength, sensation, and cranial nerves is limited at this time. Patient noted to spontaneously move all 4 extremities. No tremors.  ***        Labs on Admission: I have personally reviewed following labs and imaging studies  CBC: Recent Labs  Lab 02/27/24 1657 02/27/24 1714  WBC 6.5  --   NEUTROABS 2.8  --   HGB 13.2 13.6  HCT 41.0 40.0  MCV 89.9  --   PLT 204  --    Basic Metabolic Panel: Recent Labs  Lab 02/27/24 1657 02/27/24 1714  NA 139 141  K 3.8 4.0  CL 102 104  CO2 26  --   GLUCOSE 96 93  BUN 14 16  CREATININE 0.82 0.80  CALCIUM  9.7  --    GFR: Estimated Creatinine Clearance: 53.2 mL/min (by C-G formula based on SCr of 0.8 mg/dL). Liver Function Tests: Recent Labs  Lab 02/27/24 1657  AST 29  ALT 14  ALKPHOS 60  BILITOT 0.4  PROT 7.3  ALBUMIN 4.2   No results for input(s): LIPASE, AMYLASE in the last 168 hours. No results for input(s): AMMONIA in the last 168 hours. Coagulation Profile: Recent Labs  Lab 02/27/24 1657  INR 1.0   Cardiac Enzymes: No results for input(s): CKTOTAL, CKMB, CKMBINDEX, TROPONINI in the last 168 hours. BNP (last 3 results) No results for input(s): PROBNP in the last 8760 hours. HbA1C: No results for input(s): HGBA1C in the last 72 hours. CBG: Recent Labs  Lab 02/27/24 1718  GLUCAP 82   Lipid Profile: No results for input(s): CHOL, HDL, LDLCALC, TRIG, CHOLHDL, LDLDIRECT in the last 72 hours. Thyroid  Function Tests: No results for input(s): TSH, T4TOTAL, FREET4, T3FREE, THYROIDAB in the last 72 hours. Anemia Panel: No  results for input(s): VITAMINB12, FOLATE, FERRITIN, TIBC, IRON, RETICCTPCT in the last 72 hours. Urine analysis:    Component Value Date/Time   LABSPEC 1.015 11/10/2020 1046   PHURINE 7.5 11/10/2020 1046   GLUCOSEU NEGATIVE 11/10/2020 1046   HGBUR LARGE (A) 11/10/2020 1046   BILIRUBINUR small (A) 07/08/2021 1528   KETONESUR trace (5) (A) 07/08/2021 1528   KETONESUR NEGATIVE 11/10/2020 1046   PROTEINUR =30 (A) 07/08/2021 1528   PROTEINUR NEGATIVE 11/10/2020 1046   UROBILINOGEN 0.2 07/08/2021 1528   UROBILINOGEN 0.2 11/10/2020 1046   NITRITE Negative 07/08/2021 1528   NITRITE NEGATIVE 11/10/2020 1046   LEUKOCYTESUR Negative 07/08/2021 1528   LEUKOCYTESUR LARGE (A) 11/10/2020 1046    Radiological Exams on Admission: MR BRAIN WO CONTRAST Result Date: 02/27/2024 EXAM: MRI BRAIN WITHOUT CONTRAST  02/27/2024 05:58:05 PM TECHNIQUE: Multiplanar multisequence MRI of the head/brain was performed without the administration of intravenous contrast. COMPARISON: Head CT 02/27/2024. CLINICAL HISTORY: Neuro deficit, acute, stroke suspected. Slurred speech, dizziness, and headache. FINDINGS: BRAIN AND VENTRICLES: There is no evidence of an acute infarct, intracranial hemorrhage, mass, midline shift, hydrocephalus, or extra-axial fluid collection. Patchy T2 hyperintensities in the cerebral white matter and pons are nonspecific but compatible with mild chronic small vessel ischemic disease. Mild cerebral atrophy is within normal limits for age. Major intracranial vascular flow voids are preserved. ORBITS: Bilateral cataract extraction. SINUSES AND MASTOIDS: Moderate right and mild left ethmoid air cell mucosal thickening. Clear mastoid air cells. BONES AND SOFT TISSUES: Normal marrow signal. No significant soft tissue abnormality. IMPRESSION: 1. No acute intracranial abnormality. 2. Mild chronic small vessel ischemic disease. Electronically signed by: Dasie Hamburg MD MD 02/27/2024 06:03 PM EST RP  Workstation: [id number]   CT HEAD CODE STROKE WO CONTRAST (LKW 0-4.5h, LVO 0-24h) Result Date: 02/27/2024 EXAM: CT HEAD WITHOUT CONTRAST 02/27/2024 05:18:13 PM TECHNIQUE: CT of the head was performed without the administration of intravenous contrast. Automated exposure control, iterative reconstruction, and/or weight based adjustment of the mA/kV was utilized to reduce the radiation dose to as low as reasonably achievable. COMPARISON: None available. CLINICAL HISTORY: Neurological deficit, acute, stroke suspected. Slurred speech, dizziness, and headache. FINDINGS: BRAIN AND VENTRICLES: There is no evidence of an acute infarct, intracranial hemorrhage, mass, midline shift, hydrocephalus, or extra-axial fluid collection. Cerebral volume is normal for age. Hypodensities in the cerebral white matter bilaterally are nonspecific but compatible with mild chronic small vessel ischemic disease. Calcified atherosclerosis at the skull base. ORBITS: Bilateral cataract extraction. SINUSES: Moderate right and mild left ethmoid air cell mucosal thickening. Clear mastoid air cells. SOFT TISSUES AND SKULL: No acute soft tissue abnormality. No skull fracture. [place] Stroke Program Early CT Score (ASPECTS) ----- Ganglionic (caudate, IC, lentiform nucleus, insula, M1-M3): 7 Supraganglionic (M4-M6): 3 Total: 10 IMPRESSION: 1. No acute intracranial abnormality. ASPECTS of 10. 2. Mild chronic small vessel ischemic disease. 3. These results were communicated to Dr. JUDITHANN Raddle at 5:30 pm on 02/27/2024 by secure text page via the [facility] messaging system. Electronically signed by: Dasie Hamburg MD MD 02/27/2024 05:31 PM EST RP Workstation: [id number]      Assessment/Plan   Principal Problem:   Hypertensive crisis   ***            #) Hypertensive crisis: ***    EDP discussed patient's case with on-call neurology, Dr. Raddle, who is formally consulting, and feels that presentation is less suggestive of TIA, but rather  more consistent with hypertensive crisis. Dr. Raddle does not recommend additional TIA workup at this time and conveys no need to observe permissive hypertension, but rather recommends gradual normalization of blood pressure.                    ***                      ***                     ***                     ***                     ***                      ***                     ***                     ***                     ***                     ***                    ***                   ***  DVT prophylaxis: SCD's ***  Code Status: Full code*** Family Communication: none*** Disposition Plan: Per Rounding Team Consults called: EDP discussed patient's case with on-call neurology, Dr. Nichola, who is formally consulting, and feels that presentation is less suggestive of TIA, but rather more consistent with hypertensive crisis. Dr. Nichola does not recommend additional TIA workup at this time and conveys no need to observe permissive hypertension, but rather recommends gradual normalization of blood pressure.  ;  Admission status: ***     I SPENT GREATER THAN 75 *** MINUTES IN CLINICAL CARE TIME/MEDICAL DECISION-MAKING IN COMPLETING THIS ADMISSION.      Eva NOVAK Karlton Maya DO [facility]  From 7PM - 7AM   02/27/2024, 7:56 PM   ***      [1]  Allergies Allergen Reactions   Penicillin G     Other reaction(s): throat closed   "

## 2024-02-28 ENCOUNTER — Other Ambulatory Visit (HOSPITAL_COMMUNITY): Payer: Self-pay

## 2024-02-28 ENCOUNTER — Observation Stay (HOSPITAL_COMMUNITY)

## 2024-02-28 DIAGNOSIS — R471 Dysarthria and anarthria: Secondary | ICD-10-CM | POA: Diagnosis present

## 2024-02-28 DIAGNOSIS — I169 Hypertensive crisis, unspecified: Secondary | ICD-10-CM | POA: Diagnosis not present

## 2024-02-28 DIAGNOSIS — I10 Essential (primary) hypertension: Secondary | ICD-10-CM | POA: Diagnosis not present

## 2024-02-28 DIAGNOSIS — G459 Transient cerebral ischemic attack, unspecified: Secondary | ICD-10-CM

## 2024-02-28 DIAGNOSIS — R112 Nausea with vomiting, unspecified: Secondary | ICD-10-CM | POA: Diagnosis present

## 2024-02-28 LAB — COMPREHENSIVE METABOLIC PANEL WITH GFR
ALT: 13 U/L (ref 0–44)
AST: 25 U/L (ref 15–41)
Albumin: 4 g/dL (ref 3.5–5.0)
Alkaline Phosphatase: 57 U/L (ref 38–126)
Anion gap: 10 (ref 5–15)
BUN: 12 mg/dL (ref 8–23)
CO2: 26 mmol/L (ref 22–32)
Calcium: 9.2 mg/dL (ref 8.9–10.3)
Chloride: 106 mmol/L (ref 98–111)
Creatinine, Ser: 0.76 mg/dL (ref 0.44–1.00)
GFR, Estimated: >60 mL/min
Glucose, Bld: 104 mg/dL — ABNORMAL HIGH (ref 70–99)
Potassium: 4 mmol/L (ref 3.5–5.1)
Sodium: 142 mmol/L (ref 135–145)
Total Bilirubin: 0.5 mg/dL (ref 0.0–1.2)
Total Protein: 6.7 g/dL (ref 6.5–8.1)

## 2024-02-28 LAB — CBC WITH DIFFERENTIAL/PLATELET
Abs Immature Granulocytes: 0.03 K/uL (ref 0.00–0.07)
Basophils Absolute: 0 K/uL (ref 0.0–0.1)
Basophils Relative: 0 %
Eosinophils Absolute: 0.1 K/uL (ref 0.0–0.5)
Eosinophils Relative: 2 %
HCT: 38.9 % (ref 36.0–46.0)
Hemoglobin: 12.6 g/dL (ref 12.0–15.0)
Immature Granulocytes: 0 %
Lymphocytes Relative: 28 %
Lymphs Abs: 2.1 K/uL (ref 0.7–4.0)
MCH: 28.9 pg (ref 26.0–34.0)
MCHC: 32.4 g/dL (ref 30.0–36.0)
MCV: 89.2 fL (ref 80.0–100.0)
Monocytes Absolute: 0.5 K/uL (ref 0.1–1.0)
Monocytes Relative: 7 %
Neutro Abs: 4.7 K/uL (ref 1.7–7.7)
Neutrophils Relative %: 63 %
Platelets: 183 K/uL (ref 150–400)
RBC: 4.36 MIL/uL (ref 3.87–5.11)
RDW: 13.3 % (ref 11.5–15.5)
WBC: 7.5 K/uL (ref 4.0–10.5)
nRBC: 0 % (ref 0.0–0.2)

## 2024-02-28 LAB — ECHOCARDIOGRAM COMPLETE
AR max vel: 2.72 cm2
AV Area VTI: 2.58 cm2
AV Area mean vel: 2.65 cm2
AV Mean grad: 3 mmHg
AV Peak grad: 6.3 mmHg
Ao pk vel: 1.25 m/s
Area-P 1/2: 2.66 cm2
Height: 69 in
S' Lateral: 2.4 cm
Weight: 2271.62 [oz_av]

## 2024-02-28 LAB — MAGNESIUM: Magnesium: 2 mg/dL (ref 1.7–2.4)

## 2024-02-28 LAB — LIPID PANEL
Cholesterol: 175 mg/dL (ref 0–200)
HDL: 88 mg/dL
LDL Cholesterol: 78 mg/dL (ref 0–99)
Total CHOL/HDL Ratio: 2 ratio
Triglycerides: 44 mg/dL
VLDL: 9 mg/dL (ref 0–40)

## 2024-02-28 LAB — T4, FREE: Free T4: 1.13 ng/dL (ref 0.80–2.00)

## 2024-02-28 LAB — TSH: TSH: 6.99 u[IU]/mL — ABNORMAL HIGH (ref 0.350–4.500)

## 2024-02-28 LAB — HEMOGLOBIN A1C
Hgb A1c MFr Bld: 5.4 % (ref 4.8–5.6)
Mean Plasma Glucose: 108.28 mg/dL

## 2024-02-28 MED ORDER — ASPIRIN 81 MG PO TBEC
81.0000 mg | DELAYED_RELEASE_TABLET | Freq: Every day | ORAL | Status: DC
  Administered 2024-02-28: 81 mg via ORAL
  Filled 2024-02-28: qty 1

## 2024-02-28 MED ORDER — ATORVASTATIN CALCIUM 10 MG PO TABS
10.0000 mg | ORAL_TABLET | Freq: Every day | ORAL | 0 refills | Status: AC
  Filled 2024-02-28: qty 90, 90d supply, fill #0

## 2024-02-28 MED ORDER — ATORVASTATIN CALCIUM 10 MG PO TABS
10.0000 mg | ORAL_TABLET | Freq: Every day | ORAL | Status: DC
  Administered 2024-02-28: 10 mg via ORAL
  Filled 2024-02-28: qty 1

## 2024-02-28 MED ORDER — AMLODIPINE BESYLATE 5 MG PO TABS
5.0000 mg | ORAL_TABLET | Freq: Every day | ORAL | 0 refills | Status: AC
  Filled 2024-02-28: qty 90, 90d supply, fill #0

## 2024-02-28 MED ORDER — STROKE: EARLY STAGES OF RECOVERY BOOK: Freq: Once | Status: DC

## 2024-02-28 MED ORDER — ASPIRIN 81 MG PO TBEC
81.0000 mg | DELAYED_RELEASE_TABLET | Freq: Every day | ORAL | 0 refills | Status: AC
  Filled 2024-02-28: qty 90, 90d supply, fill #0

## 2024-02-28 MED ORDER — IOHEXOL 350 MG/ML SOLN
75.0000 mL | Freq: Once | INTRAVENOUS | Status: AC | PRN
  Administered 2024-02-28: 75 mL via INTRAVENOUS

## 2024-02-28 MED ORDER — CLOPIDOGREL BISULFATE 75 MG PO TABS
75.0000 mg | ORAL_TABLET | Freq: Every day | ORAL | Status: DC
  Administered 2024-02-28: 75 mg via ORAL
  Filled 2024-02-28: qty 1

## 2024-02-28 NOTE — Progress Notes (Signed)
 NEUROLOGY CONSULT FOLLOW UP NOTE   Date of service: February 28, 2024 Patient Name: Dana Watson MRN:  [id number] DOB:  [birth year]  Interval Hx/subjective   Slurred speech x 30 mins that spontaneously resolved.  She also had some difficulty swallowing.  ABCD2 score of 4. Will treat as TIA.  No prior history of similar episodes.  No family history of strokes.  She does not smoke, does not drink alcohol, does not use any recreational substances.  She is an avid walker and goes to the gym a couple days a week.  She does endorse that since her husband passed away, she is not cooking and is often that she takes a multivitamin at home.  She endorses a family history of hypotension.  She has a blood pressure monitor at home but does not check her blood pressure as frequently as she should.  She does endorse that  her blood pressure was elevated to 190s when she had this episode.  Her niece at the bedside reports that it was up to 200s when EMS got there.  Vitals   Vitals:   02/28/24 0923 02/28/24 0930 02/28/24 1030 02/28/24 1043  BP: (!) 155/67 (!) 165/65 (!) 156/76   Pulse:  66 63   Resp:  20 12   Temp:    98.5 F (36.9 C)  TempSrc:    Oral  SpO2:  100% 100%   Weight:      Height:         Body mass index is 20.97 kg/m.  Physical Exam   General: Laying comfortably in bed; in no acute distress.  HENT: Normal oropharynx and mucosa. Normal external appearance of ears and nose.  Neck: Supple, no pain or tenderness  CV: No JVD. No peripheral edema.  Pulmonary: Symmetric Chest rise. Normal respiratory effort.  Abdomen: Soft to touch, non-tender.  Ext: No cyanosis, edema, or deformity  Skin: No rash. Normal palpation of skin.   Musculoskeletal: Normal digits and nails by inspection. No clubbing.   Neurologic Examination  Mental status/Cognition: Alert, oriented to self, place, month and year, good attention.  Speech/language: Fluent, comprehension intact, object naming intact,  repetition intact.  Cranial nerves:   CN II Pupils equal and reactive to light, no VF deficits    CN III,IV,VI EOM intact, no gaze preference or deviation, no nystagmus    CN V normal sensation in V1, V2, and V3 segments bilaterally    CN VII no asymmetry, no nasolabial fold flattening    CN VIII normal hearing to speech    CN IX & X normal palatal elevation, no uvular deviation    CN XI 5/5 head turn and 5/5 shoulder shrug bilaterally    CN XII midline tongue protrusion    Motor:  Muscle bulk: normal, tone normal, pronator drift none tremor none Mvmt Root Nerve  Muscle Right Left Comments  SA C5/6 Ax Deltoid 5 5   EF C5/6 Mc Biceps 5 5   EE C6/7/8 Rad Triceps 5 5   WF C6/7 Med FCR     WE C7/8 PIN ECU     F Ab C8/T1 U ADM/FDI 5 5   HF L1/2/3 Fem Illopsoas 5 5   KE L2/3/4 Fem Quad 5 5   DF L4/5 D Peron Tib Ant 5 5   PF S1/2 Tibial Grc/Sol 5 5    Sensation:  Light touch Intact throughout   Pin prick    Temperature    Vibration   Proprioception  Coordination/Complex Motor:  - Finger to Nose intact BL - Heel to shin intact BL - Rapid alternating movement are normal - Gait: deferred.  Medications Current Medications[1]  Labs and Diagnostic Imaging   CBC:  Recent Labs  Lab 02/27/24 1657 02/27/24 1714 02/28/24 0253  WBC 6.5  --  7.5  NEUTROABS 2.8  --  4.7  HGB 13.2 13.6 12.6  HCT 41.0 40.0 38.9  MCV 89.9  --  89.2  PLT 204  --  183    Basic Metabolic Panel:  Lab Results  Component Value Date   NA 142 02/28/2024   K 4.0 02/28/2024   CO2 26 02/28/2024   GLUCOSE 104 (H) 02/28/2024   BUN 12 02/28/2024   CREATININE 0.76 02/28/2024   CALCIUM  9.2 02/28/2024   GFRNONAA >60 02/28/2024   Lipid Panel:  Lab Results  Component Value Date   LDLCALC 78 02/28/2024   HgbA1c:  Lab Results  Component Value Date   HGBA1C 5.4 02/28/2024   Urine Drug Screen:     Component Value Date/Time   LABOPIA NEGATIVE 02/27/2024 1657   COCAINSCRNUR NEGATIVE 02/27/2024  1657   LABBENZ NEGATIVE 02/27/2024 1657   AMPHETMU NEGATIVE 02/27/2024 1657   THCU NEGATIVE 02/27/2024 1657   LABBARB NEGATIVE 02/27/2024 1657    Alcohol Level     Component Value Date/Time   ETH <15 02/27/2024 1657   INR  Lab Results  Component Value Date   INR 1.0 02/27/2024   APTT  Lab Results  Component Value Date   APTT 28 02/27/2024   AED levels: No results found for: PHENYTOIN, ZONISAMIDE, LAMOTRIGINE, LEVETIRACETA  CT Head without contrast(Personally reviewed): CTH was negative for a large hypodensity concerning for a large territory infarct or hyperdensity concerning for an ICH  CT angio Head and Neck with contrast(Personally reviewed): No LVO.  MRI Brain(Personally reviewed): Negative for acute stroke.  Assessment   Dana Watson is a 84 y.o. female with history of osteoporosis who presents with a episode of headache along with sudden onset slurred speech that lasted over 30 minutes and then spontaneous resolution.  She was hypertensive on arrival to the ED.  Her ABCD score is a 4 and we will therefore, treat her as a TIA.  No prior history of similar symptoms.  Blood pressure continues to be slightly elevated here and I suspect that she does have baseline hypertension.  No other obvious risk factor for stroke identified.  Recommendations  - Frequent Neuro checks per stroke unit protocol - Recommend obtaining TTE  - LDL is 78, will start very low-dose atorvastatin  at 10 mg daily. -HbA1c is not consistent with diabetes. - Antithrombotic -aspirin  81 mg daily. - Recommend DVT ppx - SBP goal -aim for gradual normotension. - Recommend Telemetry monitoring for arrythmia - Recommend bedside swallow screen prior to PO intake. - Stroke education booklet - Recommend PT/OT/SLP consult - Can be discharged if TTE is negative.  Recommend follow-up outpatient with her PCP and with neurologist.  I discussed with patient and told her that she should keep a log of her  blood pressure over the next couple weeks.  It will be helpful to have that available to her PCP to see if her antihypertensive needs to be adjusted. ______________________________________________________________________   Signed, Harris Kistler, MD [facility]     [1]  Current Facility-Administered Medications:    [START ON 02/29/2024]  stroke: early stages of recovery book, , Does not apply, Once, Camylle Whicker, MD   acetaminophen  (  TYLENOL ) tablet 650 mg, 650 mg, Oral, Q6H PRN **OR** acetaminophen  (TYLENOL ) suppository 650 mg, 650 mg, Rectal, Q6H PRN, Howerter, Justin B, DO   amLODipine  (NORVASC ) tablet 5 mg, 5 mg, Oral, Daily, Howerter, Justin B, DO, 5 mg at 02/28/24 9076   aspirin  EC tablet 81 mg, 81 mg, Oral, Daily, Adilenne Ashworth, MD, 81 mg at 02/28/24 9076   clopidogrel  (PLAVIX ) tablet 75 mg, 75 mg, Oral, Daily, Taytum Wheller, MD, 75 mg at 02/28/24 9076   hydrALAZINE  (APRESOLINE ) injection 10 mg, 10 mg, Intravenous, Q4H PRN, Howerter, Justin B, DO   melatonin tablet 3 mg, 3 mg, Oral, QHS PRN, Howerter, Justin B, DO   ondansetron  (ZOFRAN ) injection 4 mg, 4 mg, Intravenous, Q6H PRN, Howerter, Justin B, DO   prochlorperazine  (COMPAZINE ) injection 10 mg, 10 mg, Intravenous, Q6H PRN, Howerter, Justin B, DO, 10 mg at 02/27/24 2005  Current Outpatient Medications:    acetaminophen  (TYLENOL ) 325 MG tablet, Take 325 mg by mouth every 4 (four) hours as needed for moderate pain (pain score 4-6)., Disp: , Rfl:    alendronate (FOSAMAX) 70 MG tablet, Take 70 mg by mouth once a week., Disp: , Rfl:    Calcium  Carb-Cholecalciferol (CALCIUM  500 +D) 500-400 MG-UNIT TABS, 1 tablet with a meal, Disp: , Rfl:    Chromium Picolinate 800 MCG TABS, Take 1 tablet by mouth daily., Disp: , Rfl:    diclofenac Sodium (VOLTAREN) 1 % GEL, Apply 2 g topically 4 (four) times daily., Disp: , Rfl:    Multiple Vitamins-Minerals (MULTIVITAMIN WITH MINERALS) tablet, Take 1 tablet by mouth  daily., Disp: , Rfl:    Zinc 50 MG TABS, Take 1 tablet by mouth daily., Disp: , Rfl:    ondansetron  (ZOFRAN -ODT) 4 MG disintegrating tablet, Take 1 tablet (4 mg total) by mouth every 8 (eight) hours as needed for nausea or vomiting. (Patient not taking: Reported on 02/27/2024), Disp: 20 tablet, Rfl: 0

## 2024-02-28 NOTE — ED Notes (Signed)
 Patient transported to CT

## 2024-02-28 NOTE — Discharge Summary (Signed)
 Physician Discharge Summary  Dana Watson FMW:[id number] DOB: 1940/04/08 DOA: 02/27/2024  PCP: Leonel Cole, MD  Admit date: 02/27/2024 Discharge date: 02/28/2024  Time spent: 40 minutes  Recommendations for Outpatient Follow-up:  Follow outpatient CBC/CMP  Follow with neurology outpatient for TIA Blood pressure control outpatient   Attention to Qtc outpatient Repeat thyroid  function tests outpatient   Discharge Diagnoses:  Principal Problem:   Hypertensive crisis Active Problems:   Nausea & vomiting   Dysarthria   Discharge Condition: stable  Diet recommendation: heart healthy  Filed Weights   02/27/24 1700  Weight: 64.4 kg    History of present illness:   Dana Watson is Dana Watson 84 y.o. female with medical history significant for osteoporosis, who is admitted to [facility] on 02/27/2024 with hypertensive crisis after presenting from home to [facility] complaining of slurred speech.   MRI was negative.  Neurology was consulted.  TIA workup complete.  Started on amlodipine  given significantly elevated BP at presentation.   Hospital Course:  Assessment and Plan:  Transient Ischemic Attack  Stroke Like Symptoms Dysarthria Hypertensive Emergency Treating as TIA MRI brain without acute intracranial abnormality CTA head/neck without significant carotid or vertebral artery atherosclerosis or stenosis - no intracranial stenosis Echo with preserved EF (see report) PT/OT/SLP - no therapy follow up  A1c 5.4, LDL  78 Appreciate neurology recommendations - recommending aspirin , lipitor.  Outpatient neurology follow up.  BP control.     Hypertension Started on amlodipine  Will monitor BP, will likely need additional BP meds, but can likely be started outpatient   Nausea In setting of hypertensive crisis   Abnormal TSH Free T4 wnl - c/w subclinical hypothyroidism Follow repeat labs outpatient    Prolonged Qtc Mild Follow outpatient Caution with QT prolonging meds    Osteoporosis alendronate    Procedures: Echo  IMPRESSIONS     1. Left ventricular ejection fraction, by estimation, is 60 to 65%. The  left ventricle has normal function. The left ventricle has no regional  wall motion abnormalities. Left ventricular diastolic parameters were  normal.   2. Right ventricular systolic function is normal. The right ventricular  size is normal.   3. The mitral valve is normal in structure. Mild mitral valve  regurgitation. No evidence of mitral stenosis.   4. The aortic valve is tricuspid. Aortic valve regurgitation is trivial.  No aortic stenosis is present.   5. The inferior vena cava is normal in size with greater than 50%  respiratory variability, suggesting right atrial pressure of 3 mmHg.   Comparison(s): No prior Echocardiogram.   Conclusion(s)/Recommendation(s): Normal biventricular function without  evidence of hemodynamically significant valvular heart disease.   Consultations: neurology  Discharge Exam: Vitals:   02/28/24 1345 02/28/24 1438  BP:    Pulse: (!) 58   Resp: 11   Temp:  98.6 F (37 C)  SpO2: 100%    See progress note   Discharge Instructions   Discharge Instructions     Ambulatory referral to Neurology   Complete by: As directed    An appointment is requested in approximately: 4 weeks   Call MD for:  difficulty breathing, headache or visual disturbances   Complete by: As directed    Call MD for:  extreme fatigue   Complete by: As directed    Call MD for:  hives   Complete by: As directed    Call MD for:  persistant dizziness or light-headedness   Complete by: As directed  Call MD for:  persistant nausea and vomiting   Complete by: As directed    Call MD for:  redness, tenderness, or signs of infection (pain, swelling, redness, odor or green/yellow discharge around incision site)   Complete by: As directed    Call MD for:  severe uncontrolled pain   Complete by: As directed    Call MD for:   temperature >100.4   Complete by: As directed    Discharge instructions   Complete by: As directed    You were seen for an episode of slurred speech.    This may have been Dana Watson TIA (transient ischemic attack or mini stroke).  Your MRI brain was reassuring.  You've improved.  We've started you on Dana Watson blood pressure medicine amlodipine .  I think you're likely to need additional dose adjustment and/or medications in the days/weeks ahead (but we should do this somewhat gradually to avoid issues with lightheadedness or dizziness/low blood pressure).    We'll start you on aspirin  and lipitor to help reduce the risk of stroke.   We'll refer you to neurology as an outpatient.   Ask your PCP to repeat your thyroid  function tests as an outpatient.   Follow up with your PCP within 1 week.  Return for new, recurrent, or worsening symptoms.  Please ask your PCP to request records from this hospitalization so they know what was done and what the next steps will be.   Increase activity slowly   Complete by: As directed       Allergies as of 02/28/2024       Reactions   Penicillin G    Other reaction(s): throat closed        Medication List     TAKE these medications    alendronate 70 MG tablet Commonly known as: FOSAMAX Take 70 mg by mouth once Dana Watson week.   amLODipine  5 MG tablet Commonly known as: NORVASC  Take 1 tablet (5 mg total) by mouth daily. Follow with your outpatient PCP for refills and dose adjustments.   aspirin  EC 81 MG tablet Take 1 tablet (81 mg total) by mouth daily. Swallow whole. Start taking on: February 29, 2024   atorvastatin  10 MG tablet Commonly known as: LIPITOR Take 1 tablet (10 mg total) by mouth daily. Start taking on: February 29, 2024   Calcium  500 +D 500-400 MG-UNIT Tabs Generic drug: Calcium  Carb-Cholecalciferol 1 tablet with Dana Watson meal   Chromium Picolinate 800 MCG Tabs Take 1 tablet by mouth daily.   diclofenac Sodium 1 % Gel Commonly known as:  VOLTAREN Apply 2 g topically 4 (four) times daily.   multivitamin with minerals tablet Take 1 tablet by mouth daily.   ondansetron  4 MG disintegrating tablet Commonly known as: ZOFRAN -ODT Take 1 tablet (4 mg total) by mouth every 8 (eight) hours as needed for nausea or vomiting.   Tylenol  325 MG tablet Generic drug: acetaminophen  Take 325 mg by mouth every 4 (four) hours as needed for moderate pain (pain score 4-6).   Zinc 50 MG Tabs Take 1 tablet by mouth daily.       Allergies[1]    The results of significant diagnostics from this hospitalization (including imaging, microbiology, ancillary and laboratory) are listed below for reference.    Significant Diagnostic Studies: ECHOCARDIOGRAM COMPLETE Result Date: 02/28/2024    ECHOCARDIOGRAM REPORT   Patient Name:   Dana Watson Date of Exam: 02/28/2024 Medical Rec #:  [id number]     Height:  69.0 in Accession #:    [id number]    Weight:       142.0 lb Date of Birth:  [birth year]    BSA:          1.786 m Patient Age:    83 years      BP:           155/76 mmHg Patient Gender: F             HR:           67 bpm. Exam Location:  Inpatient Procedure: 2D Echo, Cardiac Doppler and Color Doppler (Both Spectral and Color            Flow Doppler were utilized during procedure). Indications:    Stroke-like symptoms  History:        Patient has no prior history of Echocardiogram examinations.                 Risk Factors:Hypertension.  Sonographer:    Jayson Gaskins Referring Phys: [phone] Gagandeep Kossman CALDWELL POWELL JR IMPRESSIONS  1. Left ventricular ejection fraction, by estimation, is 60 to 65%. The left ventricle has normal function. The left ventricle has no regional wall motion abnormalities. Left ventricular diastolic parameters were normal.  2. Right ventricular systolic function is normal. The right ventricular size is normal.  3. The mitral valve is normal in structure. Mild mitral valve regurgitation. No evidence of mitral stenosis.  4. The aortic  valve is tricuspid. Aortic valve regurgitation is trivial. No aortic stenosis is present.  5. The inferior vena cava is normal in size with greater than 50% respiratory variability, suggesting right atrial pressure of 3 mmHg. Comparison(s): No prior Echocardiogram. Conclusion(s)/Recommendation(s): Normal biventricular function without evidence of hemodynamically significant valvular heart disease. FINDINGS  Left Ventricle: Left ventricular ejection fraction, by estimation, is 60 to 65%. The left ventricle has normal function. The left ventricle has no regional wall motion abnormalities. The left ventricular internal cavity size was normal in size. There is  no left ventricular hypertrophy. Left ventricular diastolic parameters were normal. Right Ventricle: The right ventricular size is normal. No increase in right ventricular wall thickness. Right ventricular systolic function is normal. Left Atrium: Left atrial size was normal in size. Right Atrium: Right atrial size was normal in size. Pericardium: There is no evidence of pericardial effusion. Mitral Valve: The mitral valve is normal in structure. Mild mitral valve regurgitation. No evidence of mitral valve stenosis. Tricuspid Valve: The tricuspid valve is normal in structure. Tricuspid valve regurgitation is mild . No evidence of tricuspid stenosis. Aortic Valve: The aortic valve is tricuspid. Aortic valve regurgitation is trivial. No aortic stenosis is present. Aortic valve mean gradient measures 3.0 mmHg. Aortic valve peak gradient measures 6.2 mmHg. Aortic valve area, by VTI measures 2.58 cm. Pulmonic Valve: The pulmonic valve was grossly normal. Pulmonic valve regurgitation is mild. No evidence of pulmonic stenosis. Aorta: The aortic root is normal in size and structure. Venous: The inferior vena cava is normal in size with greater than 50% respiratory variability, suggesting right atrial pressure of 3 mmHg. IAS/Shunts: The atrial septum is grossly normal.   LEFT VENTRICLE PLAX 2D LVIDd:         4.40 cm   Diastology LVIDs:         2.40 cm   LV e' medial:    7.07 cm/s LV PW:         0.80 cm   LV E/e' medial:  13.4 LV IVS:  1.00 cm   LV e' lateral:   9.68 cm/s LVOT diam:     1.86 cm   LV E/e' lateral: 9.8 LV SV:         76 LV SV Index:   42 LVOT Area:     2.72 cm  RIGHT VENTRICLE RV S prime:     15.40 cm/s TAPSE (M-mode): 2.7 cm LEFT ATRIUM             Index        RIGHT ATRIUM           Index LA Vol (A2C):   40.4 ml 22.62 ml/m  RA Area:     11.50 cm LA Vol (A4C):   21.0 ml 11.76 ml/m  RA Volume:   23.60 ml  13.21 ml/m LA Biplane Vol: 30.7 ml 17.19 ml/m  AORTIC VALVE AV Area (Vmax):    2.72 cm AV Area (Vmean):   2.65 cm AV Area (VTI):     2.58 cm AV Vmax:           125.00 cm/s AV Vmean:          86.500 cm/s AV VTI:            0.294 m AV Peak Grad:      6.2 mmHg AV Mean Grad:      3.0 mmHg LVOT Vmax:         125.00 cm/s LVOT Vmean:        84.400 cm/s LVOT VTI:          0.279 m LVOT/AV VTI ratio: 0.95  AORTA Ao Root diam: 2.96 cm MITRAL VALVE MV Area (PHT): 2.66 cm     SHUNTS MV Decel Time: 285 msec     Systemic VTI:  0.28 m MV E velocity: 94.90 cm/s   Systemic Diam: 1.86 cm MV Ho Parisi velocity: 108.00 cm/s MV E/Tanyiah Laurich ratio:  0.88 Dana Bruckner MD Electronically signed by Dana Bruckner MD Signature Date/Time: 02/28/2024/3:23:23 PM    Final    CT ANGIO HEAD NECK W WO CM Result Date: 02/28/2024 EXAM: CTA Head and Neck with Intravenous Contrast. CT Head without Contrast. CLINICAL HISTORY: 84 year old female with acute neuro deficit, stroke suspected, code stroke presentation yesterday, unrevealing MRI. TECHNIQUE: Axial CTA images of the head and neck performed with intravenous contrast. MIP reconstructed images were created and reviewed. Axial computed tomography images of the head/brain performed without intravenous contrast. Note: Per PQRS, the description of internal carotid artery percent stenosis, including 0 percent or normal exam, is based on  [place] American Symptomatic Carotid Endarterectomy Trial (NASCET) criteria. Dose reduction technique was used including one or more of the following: automated exposure control, adjustment of mA and kV according to patient size, and/or iterative reconstruction. CONTRAST: 75 ml omnipaque  350 COMPARISON: CT head and MRI brain 02/27/2024. FINDINGS: CT HEAD: BRAIN: Stable non-contrast CT appearance of the brain. VENTRICLES: No hydrocephalus. ORBITS: The orbits are unremarkable. SINUSES AND MASTOIDS: Stable paranasal sinus mucoperiosteal thickening. CTA NECK: 3 vessel aortic arch with mild tortuosity and atherosclerosis. Streak artifact at the right thoracic inlet related to dense subclavian venous contrast. Limited visualization of the right subclavian artery origin due to streak artifact. Mild proximal left subclavian artery atherosclerosis without stenosis. COMMON CAROTID ARTERIES: No brachiocephalic artery or visible right CCA atherosclerosis. No significant stenosis. No dissection or occlusion. INTERNAL CAROTID ARTERIES: Minimal plaque at the right ICA origin. Minimal right siphon calcified atherosclerosis. No right carotid stenosis. Little to no left carotid atherosclerosis and  no left carotid stenosis. No dissection or occlusion. VERTEBRAL ARTERIES: Calcified atherosclerosis at the right vertebral artery origin with only mild stenosis (series 12 image 110). Normal left vertebral artery origin. Codominant vertebral arteries with no stenosis to the vertebrobasilar junction. No dissection or occlusion. CTA HEAD: ANTERIOR CEREBRAL ARTERIES: Normal ACA origins. No significant stenosis. No occlusion. No aneurysm. Posterior and anterior communicating arteries are diminutive or absent (normal variant). MIDDLE CEREBRAL ARTERIES: Normal MCA origins. No significant stenosis. No occlusion. No aneurysm. POSTERIOR CEREBRAL ARTERIES: Normal PCA origins. No significant stenosis. No occlusion. No aneurysm. BASILAR ARTERY: Tortuous  vertebrobasilar junction and basilar artery without stenosis. No aneurysm. Normal SCA origins. OTHER: LUNGS: Mild apical lung scarring. INTRACRANIAL VENOUS: Major dural venous sinuses are enhancing and patent. SOFT TISSUES: Nonvascular neck soft tissue spaces no acute finding. No masses or lymphadenopathy. BONES: Cervical spine degeneration with mild cervicothoracic scoliosis. No acute osseous abnormality. IMPRESSION: 1. No significant carotid or vertebral artery atherosclerosis or stenosis. No intracranial stenosis. 2. Stable CT appearance of the brain. Electronically signed by: Helayne Hurst MD MD 02/28/2024 09:25 AM EST RP Workstation: [id number]   MR BRAIN WO CONTRAST Result Date: 02/27/2024 EXAM: MRI BRAIN WITHOUT CONTRAST 02/27/2024 05:58:05 PM TECHNIQUE: Multiplanar multisequence MRI of the head/brain was performed without the administration of intravenous contrast. COMPARISON: Head CT 02/27/2024. CLINICAL HISTORY: Neuro deficit, acute, stroke suspected. Slurred speech, dizziness, and headache. FINDINGS: BRAIN AND VENTRICLES: There is no evidence of an acute infarct, intracranial hemorrhage, mass, midline shift, hydrocephalus, or extra-axial fluid collection. Patchy T2 hyperintensities in the cerebral white matter and pons are nonspecific but compatible with mild chronic small vessel ischemic disease. Mild cerebral atrophy is within normal limits for age. Major intracranial vascular flow voids are preserved. ORBITS: Bilateral cataract extraction. SINUSES AND MASTOIDS: Moderate right and mild left ethmoid air cell mucosal thickening. Clear mastoid air cells. BONES AND SOFT TISSUES: Normal marrow signal. No significant soft tissue abnormality. IMPRESSION: 1. No acute intracranial abnormality. 2. Mild chronic small vessel ischemic disease. Electronically signed by: Dasie Hamburg MD MD 02/27/2024 06:03 PM EST RP Workstation: [id number]   CT HEAD CODE STROKE WO CONTRAST (LKW 0-4.5h, LVO 0-24h) Result Date:  02/27/2024 EXAM: CT HEAD WITHOUT CONTRAST 02/27/2024 05:18:13 PM TECHNIQUE: CT of the head was performed without the administration of intravenous contrast. Automated exposure control, iterative reconstruction, and/or weight based adjustment of the mA/kV was utilized to reduce the radiation dose to as low as reasonably achievable. COMPARISON: None available. CLINICAL HISTORY: Neurological deficit, acute, stroke suspected. Slurred speech, dizziness, and headache. FINDINGS: BRAIN AND VENTRICLES: There is no evidence of an acute infarct, intracranial hemorrhage, mass, midline shift, hydrocephalus, or extra-axial fluid collection. Cerebral volume is normal for age. Hypodensities in the cerebral white matter bilaterally are nonspecific but compatible with mild chronic small vessel ischemic disease. Calcified atherosclerosis at the skull base. ORBITS: Bilateral cataract extraction. SINUSES: Moderate right and mild left ethmoid air cell mucosal thickening. Clear mastoid air cells. SOFT TISSUES AND SKULL: No acute soft tissue abnormality. No skull fracture. [place] Stroke Program Early CT Score (ASPECTS) ----- Ganglionic (caudate, IC, lentiform nucleus, insula, M1-M3): 7 Supraganglionic (M4-M6): 3 Total: 10 IMPRESSION: 1. No acute intracranial abnormality. ASPECTS of 10. 2. Mild chronic small vessel ischemic disease. 3. These results were communicated to Dr. JUDITHANN Raddle at 5:30 pm on 02/27/2024 by secure text page via the [facility] messaging system. Electronically signed by: Dasie Hamburg MD MD 02/27/2024 05:31 PM EST RP Workstation: [id number]    Microbiology: No results found for this  or any previous visit (from the past 240 hours).   Labs: Basic Metabolic Panel: Recent Labs  Lab 02/27/24 1657 02/27/24 1714 02/28/24 0253  NA 139 141 142  K 3.8 4.0 4.0  CL 102 104 106  CO2 26  --  26  GLUCOSE 96 93 104*  BUN 14 16 12   CREATININE 0.82 0.80 0.76  CALCIUM  9.7  --  9.2  MG  --   --  2.0   Liver Function  Tests: Recent Labs  Lab 02/27/24 1657 02/28/24 0253  AST 29 25  ALT 14 13  ALKPHOS 60 57  BILITOT 0.4 0.5  PROT 7.3 6.7  ALBUMIN 4.2 4.0   No results for input(s): LIPASE, AMYLASE in the last 168 hours. No results for input(s): AMMONIA in the last 168 hours. CBC: Recent Labs  Lab 02/27/24 1657 02/27/24 1714 02/28/24 0253  WBC 6.5  --  7.5  NEUTROABS 2.8  --  4.7  HGB 13.2 13.6 12.6  HCT 41.0 40.0 38.9  MCV 89.9  --  89.2  PLT 204  --  183   Cardiac Enzymes: No results for input(s): CKTOTAL, CKMB, CKMBINDEX, TROPONINI in the last 168 hours. BNP: BNP (last 3 results) No results for input(s): BNP in the last 8760 hours.  ProBNP (last 3 results) No results for input(s): PROBNP in the last 8760 hours.  CBG: Recent Labs  Lab 02/27/24 1718  GLUCAP 82       Signed:  Meliton Monte MD.  [facility] 02/28/2024, 3:38 PM       [1]  Allergies Allergen Reactions   Penicillin G     Other reaction(s): throat closed

## 2024-02-28 NOTE — Progress Notes (Signed)
 Transition of Care [facility]) - Inpatient Brief Assessment   Patient Details  Name: Dana Watson MRN: [id number] Date of Birth: Jan 01, 1941  Transition of Care [facility] CM/SW Contact:    Debarah Saunas, RN Phone Number: 02/28/2024, 9:49 AM   Clinical Narrative: Inpatient Care Management (ICM) has reviewed patient at bedside and no ICM needs have been identified at this time. We will continue to monitor patient advancement through interdisciplinary progression rounds. If new patient transition needs arise, please place a ICM consult.    Transition of Care Asessment: Insurance and Status: (P) Insurance coverage has been reviewed Patient has primary care physician: (P) Yes (Leonel, Eli, MD) Home environment has been reviewed: (P) From home alone Prior level of function:: (P) independent Prior/Current Home Services: (P) No current home services Social Drivers of Health Review: (P) SDOH reviewed no interventions necessary Readmission risk has been reviewed: (P) No Transition of care needs: (P) no transition of care needs at this time

## 2024-02-28 NOTE — Treatment Plan (Signed)
 Likely discharge.  Please do not transfer upstairs without discussing with attending.

## 2024-02-28 NOTE — ED Notes (Signed)
 Confirmed by Perri M.D. regular diet at this time.

## 2024-02-28 NOTE — ED Notes (Signed)
 Patient has to coming to transport home if discharge.

## 2024-02-28 NOTE — Care Management Obs Status (Signed)
 MEDICARE OBSERVATION STATUS NOTIFICATION   Patient Details  Name: Dana Watson MRN: [id number] Date of Birth: 02/10/1941   Medicare Observation Status Notification Given:  Yes    Debarah Saunas, RN 02/28/2024, 9:48 AM

## 2024-02-28 NOTE — Progress Notes (Signed)
 " PROGRESS NOTE    Dana Watson  FMW:[id number] DOB: May 07, 1940 DOA: 02/27/2024 PCP: Leonel Cole, MD  Chief Complaint  Patient presents with   Code Stroke    Brief Narrative:   Dana Watson is Dana Watson 84 y.o. female with medical history significant for osteoporosis, who is admitted to [facility] on 02/27/2024 with hypertensive crisis after presenting from home to [facility] ED complaining of slurred speech.   Neurology consulted, TIA workup underway.  Assessment & Plan:   Principal Problem:   Hypertensive crisis Active Problems:   Nausea & vomiting   Dysarthria  Stroke Like Symptoms Dysarthria Hypertensive Emergency TIA vs related to hypertensive emergency MRI brain without acute intracranial abnormality CTA head/neck pending Echo pending PT/OT/SLP A1c, LDL  Appreciate neurology recommendations  Hypertension Started on amlodipine  Will monitor BP, will likely need additional BP meds, but can likely be started outpatient  Nausea In setting of hypertensive crisis  Abnormal TSH Follow free T4  Prolonged Qtc Mild Follow outpatient Caution with QT prolonging meds  Osteoporosis alendronate    DVT prophylaxis: SCD Code Status: full Family Communication: none Disposition:   Status is: Observation The patient remains OBS appropriate and will d/c before 2 midnights.   Consultants:  neurology  Procedures:  pending  Antimicrobials:  Anti-infectives (From admission, onward)    None       Subjective: Feels back to her baseline  Objective: Vitals:   02/28/24 0615 02/28/24 0658 02/28/24 0700 02/28/24 0800  BP: (!) 141/69  (!) 158/54 (!) 147/58  Pulse: 66  64 62  Resp: 11  17 18   Temp:  97.9 F (36.6 C)    TempSrc:  Oral    SpO2: 100%  100% 96%  Weight:      Height:       No intake or output data in the 24 hours ending 02/28/24 0839 Filed Weights   02/27/24 1700  Weight: 64.4 kg    Examination:  General exam: Appears calm and comfortable   Respiratory system: Clear to auscultation. Respiratory effort normal. Cardiovascular system: RRR Gastrointestinal system: Abdomen is nondistended, soft and nontender. No organomegaly or masses felt. Normal bowel sounds heard. Central nervous system: Alert and oriented. CN 2-12 intact.  5/5 strength throughout. Extremities: no LEE   Data Reviewed: I have personally reviewed following labs and imaging studies  CBC: Recent Labs  Lab 02/27/24 1657 02/27/24 1714 02/28/24 0253  WBC 6.5  --  7.5  NEUTROABS 2.8  --  4.7  HGB 13.2 13.6 12.6  HCT 41.0 40.0 38.9  MCV 89.9  --  89.2  PLT 204  --  183    Basic Metabolic Panel: Recent Labs  Lab 02/27/24 1657 02/27/24 1714 02/28/24 0253  NA 139 141 142  K 3.8 4.0 4.0  CL 102 104 106  CO2 26  --  26  GLUCOSE 96 93 104*  BUN 14 16 12   CREATININE 0.82 0.80 0.76  CALCIUM  9.7  --  9.2  MG  --   --  2.0    GFR: Estimated Creatinine Clearance: 53.2 mL/min (by C-G formula based on SCr of 0.76 mg/dL).  Liver Function Tests: Recent Labs  Lab 02/27/24 1657 02/28/24 0253  AST 29 25  ALT 14 13  ALKPHOS 60 57  BILITOT 0.4 0.5  PROT 7.3 6.7  ALBUMIN 4.2 4.0    CBG: Recent Labs  Lab 02/27/24 1718  GLUCAP 82     No results found for this or any  previous visit (from the past 240 hours).       Radiology Studies: MR BRAIN WO CONTRAST Result Date: 02/27/2024 EXAM: MRI BRAIN WITHOUT CONTRAST 02/27/2024 05:58:05 PM TECHNIQUE: Multiplanar multisequence MRI of the head/brain was performed without the administration of intravenous contrast. COMPARISON: Head CT 02/27/2024. CLINICAL HISTORY: Neuro deficit, acute, stroke suspected. Slurred speech, dizziness, and headache. FINDINGS: BRAIN AND VENTRICLES: There is no evidence of an acute infarct, intracranial hemorrhage, mass, midline shift, hydrocephalus, or extra-axial fluid collection. Patchy T2 hyperintensities in the cerebral white matter and pons are nonspecific but compatible with  mild chronic small vessel ischemic disease. Mild cerebral atrophy is within normal limits for age. Major intracranial vascular flow voids are preserved. ORBITS: Bilateral cataract extraction. SINUSES AND MASTOIDS: Moderate right and mild left ethmoid air cell mucosal thickening. Clear mastoid air cells. BONES AND SOFT TISSUES: Normal marrow signal. No significant soft tissue abnormality. IMPRESSION: 1. No acute intracranial abnormality. 2. Mild chronic small vessel ischemic disease. Electronically signed by: Dasie Hamburg MD MD 02/27/2024 06:03 PM EST RP Workstation: [id number]   CT HEAD CODE STROKE WO CONTRAST (LKW 0-4.5h, LVO 0-24h) Result Date: 02/27/2024 EXAM: CT HEAD WITHOUT CONTRAST 02/27/2024 05:18:13 PM TECHNIQUE: CT of the head was performed without the administration of intravenous contrast. Automated exposure control, iterative reconstruction, and/or weight based adjustment of the mA/kV was utilized to reduce the radiation dose to as low as reasonably achievable. COMPARISON: None available. CLINICAL HISTORY: Neurological deficit, acute, stroke suspected. Slurred speech, dizziness, and headache. FINDINGS: BRAIN AND VENTRICLES: There is no evidence of an acute infarct, intracranial hemorrhage, mass, midline shift, hydrocephalus, or extra-axial fluid collection. Cerebral volume is normal for age. Hypodensities in the cerebral white matter bilaterally are nonspecific but compatible with mild chronic small vessel ischemic disease. Calcified atherosclerosis at the skull base. ORBITS: Bilateral cataract extraction. SINUSES: Moderate right and mild left ethmoid air cell mucosal thickening. Clear mastoid air cells. SOFT TISSUES AND SKULL: No acute soft tissue abnormality. No skull fracture. [place] Stroke Program Early CT Score (ASPECTS) ----- Ganglionic (caudate, IC, lentiform nucleus, insula, M1-M3): 7 Supraganglionic (M4-M6): 3 Total: 10 IMPRESSION: 1. No acute intracranial abnormality. ASPECTS of 10. 2. Mild  chronic small vessel ischemic disease. 3. These results were communicated to Dr. JUDITHANN Raddle at 5:30 pm on 02/27/2024 by secure text page via the [facility] messaging system. Electronically signed by: Dasie Hamburg MD MD 02/27/2024 05:31 PM EST RP Workstation: [id number]        Scheduled Meds:  [START ON 02/29/2024]  stroke: early stages of recovery book   Does not apply Once   amLODipine   5 mg Oral Daily   aspirin  EC  81 mg Oral Daily   clopidogrel   75 mg Oral Daily   Continuous Infusions:   LOS: 0 days    Time spent: over 30 min     Meliton Monte, MD [facility]   To contact the attending provider between 7A-7P or the covering provider during after hours 7P-7A, please log into the web site [email or web address] and access using universal [place] password for that web site. If you do not have the password, please call the hospital operator.  02/28/2024, 8:39 AM    "

## 2024-02-28 NOTE — Evaluation (Signed)
 Physical Therapy Brief Evaluation and Discharge Note Patient Details Name: Dana Watson MRN: [id number] DOB: 1940/12/02 Today's Date: 02/28/2024   History of Present Illness  Dana Watson is a 84 y.o. female who is admitted to [facility] on 02/27/2024 with hypertensive crisis after presenting from home to [facility] ED complaining of slurred speech. Pt had TIA workup with imaging found to be negative.  Past medical history significant for osteoporosis.  Clinical Impression  Pt presents with admitting diagnosis above. Pt today was able to ambulate in hallway with no AD at supervision level and perform DGI. Pt scored 21/24 on DGI indicating that she is not a falls risk. Pt reports prior to admission that she was fully independent going to the [facility] twice a week to work on balance activities. Pt presents at or near baseline mobility. Pt has no further acute PT needs and will be signing off. Pt was educated on BEFAST acronym for monitoring potential stroke symptoms which pt stated that she well understood after her husband had 2 strokes. Re consult PT if mobility status changes. Pt would benefit from continued mobility with mobility specialist during acute stay.        PT Assessment Patient does not need any further PT services  Assistance Needed at Discharge  PRN    Equipment Recommendations None recommended by PT  Recommendations for Other Services       Precautions/Restrictions Precautions Precautions: Fall Recall of Precautions/Restrictions: Intact Restrictions Weight Bearing Restrictions Per Provider Order: No        Mobility  Bed Mobility   Supine/Sidelying to sit: Modified independent (Device/Increased time) Sit to supine/sidelying: Modified independent (Device/Increased time) General bed mobility comments: HOB elevated  Transfers Overall transfer level: Independent Equipment used: None                    Ambulation/Gait Ambulation/Gait assistance:  Supervision Gait Distance (Feet): 250 Feet Assistive device: None Gait Pattern/deviations: WFL(Within Functional Limits) Gait Speed: Pace WFL General Gait Details: no LOB noted. DGI performed.  Home Activity Instructions    Stairs Stairs:  (Marched in place to simulate stairs)          Modified Rankin (Stroke Patients Only) Modified Rankin (Stroke Patients Only) Pre-Morbid Rankin Score: No symptoms Modified Rankin: No symptoms      Balance Overall balance assessment: Independent               Standardized Balance Assessment Standardized Balance Assessment : Dynamic Gait Index Dynamic Gait Index Level Surface: Normal Change in Gait Speed: Normal Gait with Horizontal Head Turns: Mild Impairment Gait with Vertical Head Turns: Mild Impairment Gait and Pivot Turn: Normal Step Over Obstacle: Normal Step Around Obstacles: Mild Impairment Steps: Normal Total Score: 21      Pertinent Vitals/Pain PT - Brief Vital Signs All Vital Signs Stable: Other (comment) (BP: 160/59 seated, BP: 158/108 during ambulation, BP: 167/57 following ambulation.) Pain Assessment Pain Assessment: No/denies pain     Home Living Family/patient expects to be discharged to:: Private residence Living Arrangements: Alone Available Help at Discharge: Family;Available PRN/intermittently Home Environment: Stairs to enter;Rail - right;Rail - left  Stairs-Number of Steps: 3 Home Equipment: Chiropractor (4 wheels);Rolling Walker (2 wheels);Cane - single point;Shower seat - built in;Shower seat;Grab bars - tub/shower;Grab bars - toilet;Hand held shower head        Prior Function Level of Independence: Independent Comments: Pt states that she goes to [facility] 2 days a week for balance activities.  UE/LE Assessment   UE ROM/Strength/Tone/Coordination: WFL    LE ROM/Strength/Tone/Coordination: [facility]      Communication   Communication Communication: No apparent difficulties      Cognition Overall Cognitive Status: Appears within functional limits for tasks assessed/performed       General Comments General comments (skin integrity, edema, etc.): BP: 160/59 seated, BP: 158/108 during ambulation, BP: 167/57 following ambulation. Stepson present.    Exercises     Assessment/Plan    PT Problem List         PT Visit Diagnosis Other abnormalities of gait and mobility (R26.89)    No Skilled PT Patient at baseline level of functioning;Patient is independent with all acitivity/mobility   Co-evaluation                AMPAC 6 Clicks Help needed turning from your back to your side while in a flat bed without using bedrails?: None Help needed moving from lying on your back to sitting on the side of a flat bed without using bedrails?: None Help needed moving to and from a bed to a chair (including a wheelchair)?: None Help needed standing up from a chair using your arms (e.g., wheelchair or bedside chair)?: None Help needed to walk in hospital room?: None Help needed climbing 3-5 steps with a railing? : None 6 Click Score: 24      End of Session Equipment Utilized During Treatment: Gait belt Activity Tolerance: Patient tolerated treatment well Patient left: in bed;with call bell/phone within reach;with family/visitor present Nurse Communication: Mobility status PT Visit Diagnosis: Other abnormalities of gait and mobility (R26.89)     Time: 1000-1024 PT Time Calculation (min) (ACUTE ONLY): 24 min  Charges:   PT Evaluation $PT Eval Moderate Complexity: 1 Mod PT Treatments $Gait Training: 8-22 mins    Yuki Purves B, PT, DPT Acute Rehab Services [phone]   Aralyn Nowak  02/28/2024, 10:49 AM
